# Patient Record
Sex: Female | Born: 1950 | Race: White | Hispanic: No | Marital: Married | Smoking: Never smoker
Health system: Southern US, Community
[De-identification: ages and names within clinical notes are randomized; demographics above are authoritative.]

## PROBLEM LIST (undated history)

## (undated) DIAGNOSIS — C50919 Malignant neoplasm of unspecified site of unspecified female breast: Secondary | ICD-10-CM

## (undated) DIAGNOSIS — J45909 Unspecified asthma, uncomplicated: Secondary | ICD-10-CM

## (undated) DIAGNOSIS — F32A Depression, unspecified: Secondary | ICD-10-CM

## (undated) DIAGNOSIS — E039 Hypothyroidism, unspecified: Secondary | ICD-10-CM

## (undated) DIAGNOSIS — E119 Type 2 diabetes mellitus without complications: Secondary | ICD-10-CM

## (undated) DIAGNOSIS — G4730 Sleep apnea, unspecified: Secondary | ICD-10-CM

## (undated) DIAGNOSIS — M1990 Unspecified osteoarthritis, unspecified site: Secondary | ICD-10-CM

## (undated) HISTORY — PX: GALLBLADDER SURGERY: SHX652

## (undated) HISTORY — PX: ABDOMINAL HYSTERECTOMY: SHX81

## (undated) HISTORY — PX: CATARACT EXTRACTION: SUR2

## (undated) HISTORY — DX: Malignant neoplasm of unspecified site of unspecified female breast: C50.919

---

## 1982-07-01 HISTORY — PX: VAGINAL HYSTERECTOMY: SUR661

## 2017-01-07 DIAGNOSIS — M9904 Segmental and somatic dysfunction of sacral region: Secondary | ICD-10-CM | POA: Diagnosis not present

## 2017-01-07 DIAGNOSIS — M9903 Segmental and somatic dysfunction of lumbar region: Secondary | ICD-10-CM | POA: Diagnosis not present

## 2017-01-07 DIAGNOSIS — M461 Sacroiliitis, not elsewhere classified: Secondary | ICD-10-CM | POA: Diagnosis not present

## 2017-01-07 DIAGNOSIS — M5136 Other intervertebral disc degeneration, lumbar region: Secondary | ICD-10-CM | POA: Diagnosis not present

## 2017-01-07 DIAGNOSIS — M9905 Segmental and somatic dysfunction of pelvic region: Secondary | ICD-10-CM | POA: Diagnosis not present

## 2017-01-07 DIAGNOSIS — M4316 Spondylolisthesis, lumbar region: Secondary | ICD-10-CM | POA: Diagnosis not present

## 2017-01-07 DIAGNOSIS — M5387 Other specified dorsopathies, lumbosacral region: Secondary | ICD-10-CM | POA: Diagnosis not present

## 2017-01-07 DIAGNOSIS — Q72812 Congenital shortening of left lower limb: Secondary | ICD-10-CM | POA: Diagnosis not present

## 2017-01-09 DIAGNOSIS — M5387 Other specified dorsopathies, lumbosacral region: Secondary | ICD-10-CM | POA: Diagnosis not present

## 2017-01-09 DIAGNOSIS — M9904 Segmental and somatic dysfunction of sacral region: Secondary | ICD-10-CM | POA: Diagnosis not present

## 2017-01-09 DIAGNOSIS — M9903 Segmental and somatic dysfunction of lumbar region: Secondary | ICD-10-CM | POA: Diagnosis not present

## 2017-01-09 DIAGNOSIS — M4316 Spondylolisthesis, lumbar region: Secondary | ICD-10-CM | POA: Diagnosis not present

## 2017-01-09 DIAGNOSIS — M5136 Other intervertebral disc degeneration, lumbar region: Secondary | ICD-10-CM | POA: Diagnosis not present

## 2017-01-09 DIAGNOSIS — M461 Sacroiliitis, not elsewhere classified: Secondary | ICD-10-CM | POA: Diagnosis not present

## 2017-01-09 DIAGNOSIS — M9905 Segmental and somatic dysfunction of pelvic region: Secondary | ICD-10-CM | POA: Diagnosis not present

## 2017-01-09 DIAGNOSIS — Q72812 Congenital shortening of left lower limb: Secondary | ICD-10-CM | POA: Diagnosis not present

## 2017-01-13 DIAGNOSIS — E119 Type 2 diabetes mellitus without complications: Secondary | ICD-10-CM | POA: Diagnosis not present

## 2017-01-13 DIAGNOSIS — H401131 Primary open-angle glaucoma, bilateral, mild stage: Secondary | ICD-10-CM | POA: Diagnosis not present

## 2017-01-13 DIAGNOSIS — H2513 Age-related nuclear cataract, bilateral: Secondary | ICD-10-CM | POA: Diagnosis not present

## 2017-01-13 DIAGNOSIS — H40033 Anatomical narrow angle, bilateral: Secondary | ICD-10-CM | POA: Diagnosis not present

## 2017-01-14 DIAGNOSIS — M461 Sacroiliitis, not elsewhere classified: Secondary | ICD-10-CM | POA: Diagnosis not present

## 2017-01-14 DIAGNOSIS — M5136 Other intervertebral disc degeneration, lumbar region: Secondary | ICD-10-CM | POA: Diagnosis not present

## 2017-01-14 DIAGNOSIS — M9905 Segmental and somatic dysfunction of pelvic region: Secondary | ICD-10-CM | POA: Diagnosis not present

## 2017-01-14 DIAGNOSIS — M9903 Segmental and somatic dysfunction of lumbar region: Secondary | ICD-10-CM | POA: Diagnosis not present

## 2017-01-14 DIAGNOSIS — M4316 Spondylolisthesis, lumbar region: Secondary | ICD-10-CM | POA: Diagnosis not present

## 2017-01-14 DIAGNOSIS — M9904 Segmental and somatic dysfunction of sacral region: Secondary | ICD-10-CM | POA: Diagnosis not present

## 2017-01-14 DIAGNOSIS — Q72812 Congenital shortening of left lower limb: Secondary | ICD-10-CM | POA: Diagnosis not present

## 2017-01-14 DIAGNOSIS — M5387 Other specified dorsopathies, lumbosacral region: Secondary | ICD-10-CM | POA: Diagnosis not present

## 2017-01-15 DIAGNOSIS — M5387 Other specified dorsopathies, lumbosacral region: Secondary | ICD-10-CM | POA: Diagnosis not present

## 2017-01-15 DIAGNOSIS — M9904 Segmental and somatic dysfunction of sacral region: Secondary | ICD-10-CM | POA: Diagnosis not present

## 2017-01-15 DIAGNOSIS — M9903 Segmental and somatic dysfunction of lumbar region: Secondary | ICD-10-CM | POA: Diagnosis not present

## 2017-01-15 DIAGNOSIS — M4316 Spondylolisthesis, lumbar region: Secondary | ICD-10-CM | POA: Diagnosis not present

## 2017-01-15 DIAGNOSIS — M9905 Segmental and somatic dysfunction of pelvic region: Secondary | ICD-10-CM | POA: Diagnosis not present

## 2017-01-15 DIAGNOSIS — Q72812 Congenital shortening of left lower limb: Secondary | ICD-10-CM | POA: Diagnosis not present

## 2017-01-15 DIAGNOSIS — M461 Sacroiliitis, not elsewhere classified: Secondary | ICD-10-CM | POA: Diagnosis not present

## 2017-01-15 DIAGNOSIS — M5136 Other intervertebral disc degeneration, lumbar region: Secondary | ICD-10-CM | POA: Diagnosis not present

## 2017-01-16 DIAGNOSIS — M9905 Segmental and somatic dysfunction of pelvic region: Secondary | ICD-10-CM | POA: Diagnosis not present

## 2017-01-16 DIAGNOSIS — M4316 Spondylolisthesis, lumbar region: Secondary | ICD-10-CM | POA: Diagnosis not present

## 2017-01-16 DIAGNOSIS — M5387 Other specified dorsopathies, lumbosacral region: Secondary | ICD-10-CM | POA: Diagnosis not present

## 2017-01-16 DIAGNOSIS — Q72812 Congenital shortening of left lower limb: Secondary | ICD-10-CM | POA: Diagnosis not present

## 2017-01-16 DIAGNOSIS — M9904 Segmental and somatic dysfunction of sacral region: Secondary | ICD-10-CM | POA: Diagnosis not present

## 2017-01-16 DIAGNOSIS — M9903 Segmental and somatic dysfunction of lumbar region: Secondary | ICD-10-CM | POA: Diagnosis not present

## 2017-01-16 DIAGNOSIS — M5136 Other intervertebral disc degeneration, lumbar region: Secondary | ICD-10-CM | POA: Diagnosis not present

## 2017-01-16 DIAGNOSIS — M461 Sacroiliitis, not elsewhere classified: Secondary | ICD-10-CM | POA: Diagnosis not present

## 2017-01-22 DIAGNOSIS — M9904 Segmental and somatic dysfunction of sacral region: Secondary | ICD-10-CM | POA: Diagnosis not present

## 2017-01-22 DIAGNOSIS — M5136 Other intervertebral disc degeneration, lumbar region: Secondary | ICD-10-CM | POA: Diagnosis not present

## 2017-01-22 DIAGNOSIS — M4316 Spondylolisthesis, lumbar region: Secondary | ICD-10-CM | POA: Diagnosis not present

## 2017-01-22 DIAGNOSIS — Q72812 Congenital shortening of left lower limb: Secondary | ICD-10-CM | POA: Diagnosis not present

## 2017-01-22 DIAGNOSIS — M9905 Segmental and somatic dysfunction of pelvic region: Secondary | ICD-10-CM | POA: Diagnosis not present

## 2017-01-22 DIAGNOSIS — M9903 Segmental and somatic dysfunction of lumbar region: Secondary | ICD-10-CM | POA: Diagnosis not present

## 2017-01-22 DIAGNOSIS — M461 Sacroiliitis, not elsewhere classified: Secondary | ICD-10-CM | POA: Diagnosis not present

## 2017-01-22 DIAGNOSIS — M5387 Other specified dorsopathies, lumbosacral region: Secondary | ICD-10-CM | POA: Diagnosis not present

## 2017-01-23 DIAGNOSIS — Q72812 Congenital shortening of left lower limb: Secondary | ICD-10-CM | POA: Diagnosis not present

## 2017-01-23 DIAGNOSIS — M4316 Spondylolisthesis, lumbar region: Secondary | ICD-10-CM | POA: Diagnosis not present

## 2017-01-23 DIAGNOSIS — M9904 Segmental and somatic dysfunction of sacral region: Secondary | ICD-10-CM | POA: Diagnosis not present

## 2017-01-23 DIAGNOSIS — M461 Sacroiliitis, not elsewhere classified: Secondary | ICD-10-CM | POA: Diagnosis not present

## 2017-01-23 DIAGNOSIS — M5136 Other intervertebral disc degeneration, lumbar region: Secondary | ICD-10-CM | POA: Diagnosis not present

## 2017-01-23 DIAGNOSIS — M9905 Segmental and somatic dysfunction of pelvic region: Secondary | ICD-10-CM | POA: Diagnosis not present

## 2017-01-23 DIAGNOSIS — M5387 Other specified dorsopathies, lumbosacral region: Secondary | ICD-10-CM | POA: Diagnosis not present

## 2017-01-23 DIAGNOSIS — M9903 Segmental and somatic dysfunction of lumbar region: Secondary | ICD-10-CM | POA: Diagnosis not present

## 2017-01-27 DIAGNOSIS — M461 Sacroiliitis, not elsewhere classified: Secondary | ICD-10-CM | POA: Diagnosis not present

## 2017-01-27 DIAGNOSIS — Q72812 Congenital shortening of left lower limb: Secondary | ICD-10-CM | POA: Diagnosis not present

## 2017-01-27 DIAGNOSIS — M9905 Segmental and somatic dysfunction of pelvic region: Secondary | ICD-10-CM | POA: Diagnosis not present

## 2017-01-27 DIAGNOSIS — M9904 Segmental and somatic dysfunction of sacral region: Secondary | ICD-10-CM | POA: Diagnosis not present

## 2017-01-27 DIAGNOSIS — M9903 Segmental and somatic dysfunction of lumbar region: Secondary | ICD-10-CM | POA: Diagnosis not present

## 2017-01-27 DIAGNOSIS — M4316 Spondylolisthesis, lumbar region: Secondary | ICD-10-CM | POA: Diagnosis not present

## 2017-01-27 DIAGNOSIS — M5136 Other intervertebral disc degeneration, lumbar region: Secondary | ICD-10-CM | POA: Diagnosis not present

## 2017-01-27 DIAGNOSIS — M5387 Other specified dorsopathies, lumbosacral region: Secondary | ICD-10-CM | POA: Diagnosis not present

## 2017-01-30 DIAGNOSIS — Q72812 Congenital shortening of left lower limb: Secondary | ICD-10-CM | POA: Diagnosis not present

## 2017-01-30 DIAGNOSIS — M9904 Segmental and somatic dysfunction of sacral region: Secondary | ICD-10-CM | POA: Diagnosis not present

## 2017-01-30 DIAGNOSIS — M5387 Other specified dorsopathies, lumbosacral region: Secondary | ICD-10-CM | POA: Diagnosis not present

## 2017-01-30 DIAGNOSIS — M461 Sacroiliitis, not elsewhere classified: Secondary | ICD-10-CM | POA: Diagnosis not present

## 2017-01-30 DIAGNOSIS — M9905 Segmental and somatic dysfunction of pelvic region: Secondary | ICD-10-CM | POA: Diagnosis not present

## 2017-01-30 DIAGNOSIS — M4316 Spondylolisthesis, lumbar region: Secondary | ICD-10-CM | POA: Diagnosis not present

## 2017-01-30 DIAGNOSIS — M9903 Segmental and somatic dysfunction of lumbar region: Secondary | ICD-10-CM | POA: Diagnosis not present

## 2017-01-30 DIAGNOSIS — M5136 Other intervertebral disc degeneration, lumbar region: Secondary | ICD-10-CM | POA: Diagnosis not present

## 2017-02-03 DIAGNOSIS — M9905 Segmental and somatic dysfunction of pelvic region: Secondary | ICD-10-CM | POA: Diagnosis not present

## 2017-02-03 DIAGNOSIS — M5136 Other intervertebral disc degeneration, lumbar region: Secondary | ICD-10-CM | POA: Diagnosis not present

## 2017-02-03 DIAGNOSIS — M5387 Other specified dorsopathies, lumbosacral region: Secondary | ICD-10-CM | POA: Diagnosis not present

## 2017-02-03 DIAGNOSIS — Q72812 Congenital shortening of left lower limb: Secondary | ICD-10-CM | POA: Diagnosis not present

## 2017-02-03 DIAGNOSIS — M9903 Segmental and somatic dysfunction of lumbar region: Secondary | ICD-10-CM | POA: Diagnosis not present

## 2017-02-03 DIAGNOSIS — M461 Sacroiliitis, not elsewhere classified: Secondary | ICD-10-CM | POA: Diagnosis not present

## 2017-02-03 DIAGNOSIS — M4316 Spondylolisthesis, lumbar region: Secondary | ICD-10-CM | POA: Diagnosis not present

## 2017-02-03 DIAGNOSIS — M9904 Segmental and somatic dysfunction of sacral region: Secondary | ICD-10-CM | POA: Diagnosis not present

## 2017-02-06 DIAGNOSIS — M461 Sacroiliitis, not elsewhere classified: Secondary | ICD-10-CM | POA: Diagnosis not present

## 2017-02-06 DIAGNOSIS — Q72812 Congenital shortening of left lower limb: Secondary | ICD-10-CM | POA: Diagnosis not present

## 2017-02-06 DIAGNOSIS — M9904 Segmental and somatic dysfunction of sacral region: Secondary | ICD-10-CM | POA: Diagnosis not present

## 2017-02-06 DIAGNOSIS — M9903 Segmental and somatic dysfunction of lumbar region: Secondary | ICD-10-CM | POA: Diagnosis not present

## 2017-02-06 DIAGNOSIS — M4316 Spondylolisthesis, lumbar region: Secondary | ICD-10-CM | POA: Diagnosis not present

## 2017-02-06 DIAGNOSIS — M9905 Segmental and somatic dysfunction of pelvic region: Secondary | ICD-10-CM | POA: Diagnosis not present

## 2017-02-06 DIAGNOSIS — M5136 Other intervertebral disc degeneration, lumbar region: Secondary | ICD-10-CM | POA: Diagnosis not present

## 2017-02-06 DIAGNOSIS — M5387 Other specified dorsopathies, lumbosacral region: Secondary | ICD-10-CM | POA: Diagnosis not present

## 2017-02-13 DIAGNOSIS — M9903 Segmental and somatic dysfunction of lumbar region: Secondary | ICD-10-CM | POA: Diagnosis not present

## 2017-02-13 DIAGNOSIS — Q72812 Congenital shortening of left lower limb: Secondary | ICD-10-CM | POA: Diagnosis not present

## 2017-02-13 DIAGNOSIS — M9904 Segmental and somatic dysfunction of sacral region: Secondary | ICD-10-CM | POA: Diagnosis not present

## 2017-02-13 DIAGNOSIS — M4316 Spondylolisthesis, lumbar region: Secondary | ICD-10-CM | POA: Diagnosis not present

## 2017-02-13 DIAGNOSIS — M461 Sacroiliitis, not elsewhere classified: Secondary | ICD-10-CM | POA: Diagnosis not present

## 2017-02-13 DIAGNOSIS — M9905 Segmental and somatic dysfunction of pelvic region: Secondary | ICD-10-CM | POA: Diagnosis not present

## 2017-02-13 DIAGNOSIS — M5136 Other intervertebral disc degeneration, lumbar region: Secondary | ICD-10-CM | POA: Diagnosis not present

## 2017-02-13 DIAGNOSIS — M5387 Other specified dorsopathies, lumbosacral region: Secondary | ICD-10-CM | POA: Diagnosis not present

## 2017-02-20 DIAGNOSIS — M9903 Segmental and somatic dysfunction of lumbar region: Secondary | ICD-10-CM | POA: Diagnosis not present

## 2017-02-20 DIAGNOSIS — M5136 Other intervertebral disc degeneration, lumbar region: Secondary | ICD-10-CM | POA: Diagnosis not present

## 2017-02-20 DIAGNOSIS — M5387 Other specified dorsopathies, lumbosacral region: Secondary | ICD-10-CM | POA: Diagnosis not present

## 2017-02-20 DIAGNOSIS — M461 Sacroiliitis, not elsewhere classified: Secondary | ICD-10-CM | POA: Diagnosis not present

## 2017-02-20 DIAGNOSIS — Q72812 Congenital shortening of left lower limb: Secondary | ICD-10-CM | POA: Diagnosis not present

## 2017-02-20 DIAGNOSIS — M9904 Segmental and somatic dysfunction of sacral region: Secondary | ICD-10-CM | POA: Diagnosis not present

## 2017-02-20 DIAGNOSIS — M9905 Segmental and somatic dysfunction of pelvic region: Secondary | ICD-10-CM | POA: Diagnosis not present

## 2017-02-20 DIAGNOSIS — M4316 Spondylolisthesis, lumbar region: Secondary | ICD-10-CM | POA: Diagnosis not present

## 2017-03-06 DIAGNOSIS — M5136 Other intervertebral disc degeneration, lumbar region: Secondary | ICD-10-CM | POA: Diagnosis not present

## 2017-03-06 DIAGNOSIS — M9904 Segmental and somatic dysfunction of sacral region: Secondary | ICD-10-CM | POA: Diagnosis not present

## 2017-03-06 DIAGNOSIS — M461 Sacroiliitis, not elsewhere classified: Secondary | ICD-10-CM | POA: Diagnosis not present

## 2017-03-06 DIAGNOSIS — M5387 Other specified dorsopathies, lumbosacral region: Secondary | ICD-10-CM | POA: Diagnosis not present

## 2017-03-06 DIAGNOSIS — M4316 Spondylolisthesis, lumbar region: Secondary | ICD-10-CM | POA: Diagnosis not present

## 2017-03-06 DIAGNOSIS — G4733 Obstructive sleep apnea (adult) (pediatric): Secondary | ICD-10-CM | POA: Diagnosis not present

## 2017-03-06 DIAGNOSIS — H409 Unspecified glaucoma: Secondary | ICD-10-CM | POA: Diagnosis not present

## 2017-03-06 DIAGNOSIS — G4700 Insomnia, unspecified: Secondary | ICD-10-CM | POA: Diagnosis not present

## 2017-03-06 DIAGNOSIS — K635 Polyp of colon: Secondary | ICD-10-CM | POA: Diagnosis not present

## 2017-03-06 DIAGNOSIS — M9903 Segmental and somatic dysfunction of lumbar region: Secondary | ICD-10-CM | POA: Diagnosis not present

## 2017-03-06 DIAGNOSIS — F324 Major depressive disorder, single episode, in partial remission: Secondary | ICD-10-CM | POA: Diagnosis not present

## 2017-03-06 DIAGNOSIS — Z1389 Encounter for screening for other disorder: Secondary | ICD-10-CM | POA: Diagnosis not present

## 2017-03-06 DIAGNOSIS — Q72812 Congenital shortening of left lower limb: Secondary | ICD-10-CM | POA: Diagnosis not present

## 2017-03-06 DIAGNOSIS — E7800 Pure hypercholesterolemia, unspecified: Secondary | ICD-10-CM | POA: Diagnosis not present

## 2017-03-06 DIAGNOSIS — M9905 Segmental and somatic dysfunction of pelvic region: Secondary | ICD-10-CM | POA: Diagnosis not present

## 2017-03-06 DIAGNOSIS — J45909 Unspecified asthma, uncomplicated: Secondary | ICD-10-CM | POA: Diagnosis not present

## 2017-03-06 DIAGNOSIS — Z6841 Body Mass Index (BMI) 40.0 and over, adult: Secondary | ICD-10-CM | POA: Diagnosis not present

## 2017-03-20 DIAGNOSIS — M9905 Segmental and somatic dysfunction of pelvic region: Secondary | ICD-10-CM | POA: Diagnosis not present

## 2017-03-20 DIAGNOSIS — M9903 Segmental and somatic dysfunction of lumbar region: Secondary | ICD-10-CM | POA: Diagnosis not present

## 2017-03-20 DIAGNOSIS — M9904 Segmental and somatic dysfunction of sacral region: Secondary | ICD-10-CM | POA: Diagnosis not present

## 2017-03-20 DIAGNOSIS — M4316 Spondylolisthesis, lumbar region: Secondary | ICD-10-CM | POA: Diagnosis not present

## 2017-03-20 DIAGNOSIS — Q72812 Congenital shortening of left lower limb: Secondary | ICD-10-CM | POA: Diagnosis not present

## 2017-03-20 DIAGNOSIS — M5387 Other specified dorsopathies, lumbosacral region: Secondary | ICD-10-CM | POA: Diagnosis not present

## 2017-03-20 DIAGNOSIS — M461 Sacroiliitis, not elsewhere classified: Secondary | ICD-10-CM | POA: Diagnosis not present

## 2017-03-20 DIAGNOSIS — M5136 Other intervertebral disc degeneration, lumbar region: Secondary | ICD-10-CM | POA: Diagnosis not present

## 2017-05-08 ENCOUNTER — Other Ambulatory Visit: Payer: Self-pay | Admitting: Internal Medicine

## 2017-05-08 DIAGNOSIS — Z1159 Encounter for screening for other viral diseases: Secondary | ICD-10-CM | POA: Diagnosis not present

## 2017-05-08 DIAGNOSIS — E7800 Pure hypercholesterolemia, unspecified: Secondary | ICD-10-CM | POA: Diagnosis not present

## 2017-05-08 DIAGNOSIS — H409 Unspecified glaucoma: Secondary | ICD-10-CM | POA: Diagnosis not present

## 2017-05-08 DIAGNOSIS — Z780 Asymptomatic menopausal state: Secondary | ICD-10-CM | POA: Diagnosis not present

## 2017-05-08 DIAGNOSIS — Z0000 Encounter for general adult medical examination without abnormal findings: Secondary | ICD-10-CM | POA: Diagnosis not present

## 2017-05-08 DIAGNOSIS — G4700 Insomnia, unspecified: Secondary | ICD-10-CM | POA: Diagnosis not present

## 2017-05-08 DIAGNOSIS — F325 Major depressive disorder, single episode, in full remission: Secondary | ICD-10-CM | POA: Diagnosis not present

## 2017-05-08 DIAGNOSIS — E039 Hypothyroidism, unspecified: Secondary | ICD-10-CM | POA: Diagnosis not present

## 2017-05-08 DIAGNOSIS — Z1389 Encounter for screening for other disorder: Secondary | ICD-10-CM | POA: Diagnosis not present

## 2017-05-08 DIAGNOSIS — Z23 Encounter for immunization: Secondary | ICD-10-CM | POA: Diagnosis not present

## 2017-05-08 DIAGNOSIS — Z1231 Encounter for screening mammogram for malignant neoplasm of breast: Secondary | ICD-10-CM

## 2017-05-08 DIAGNOSIS — J45909 Unspecified asthma, uncomplicated: Secondary | ICD-10-CM | POA: Diagnosis not present

## 2017-05-08 DIAGNOSIS — G4733 Obstructive sleep apnea (adult) (pediatric): Secondary | ICD-10-CM | POA: Diagnosis not present

## 2017-05-08 DIAGNOSIS — R7303 Prediabetes: Secondary | ICD-10-CM | POA: Diagnosis not present

## 2017-05-16 DIAGNOSIS — H2513 Age-related nuclear cataract, bilateral: Secondary | ICD-10-CM | POA: Diagnosis not present

## 2017-05-16 DIAGNOSIS — E119 Type 2 diabetes mellitus without complications: Secondary | ICD-10-CM | POA: Diagnosis not present

## 2017-05-16 DIAGNOSIS — H401131 Primary open-angle glaucoma, bilateral, mild stage: Secondary | ICD-10-CM | POA: Diagnosis not present

## 2017-06-05 ENCOUNTER — Ambulatory Visit
Admission: RE | Admit: 2017-06-05 | Discharge: 2017-06-05 | Disposition: A | Discharge status: Home / Self Care | Payer: Medicare Other | Source: Ambulatory Visit | Attending: Internal Medicine | Admitting: Internal Medicine

## 2017-06-05 DIAGNOSIS — Z1231 Encounter for screening mammogram for malignant neoplasm of breast: Secondary | ICD-10-CM | POA: Diagnosis not present

## 2017-07-01 HISTORY — PX: BREAST BIOPSY: SHX20

## 2017-07-17 DIAGNOSIS — Z780 Asymptomatic menopausal state: Secondary | ICD-10-CM | POA: Diagnosis not present

## 2017-07-17 DIAGNOSIS — H01021 Squamous blepharitis right upper eyelid: Secondary | ICD-10-CM | POA: Diagnosis not present

## 2017-07-17 DIAGNOSIS — H401131 Primary open-angle glaucoma, bilateral, mild stage: Secondary | ICD-10-CM | POA: Diagnosis not present

## 2017-07-17 DIAGNOSIS — E119 Type 2 diabetes mellitus without complications: Secondary | ICD-10-CM | POA: Diagnosis not present

## 2017-07-17 DIAGNOSIS — H01025 Squamous blepharitis left lower eyelid: Secondary | ICD-10-CM | POA: Diagnosis not present

## 2017-07-17 DIAGNOSIS — H10413 Chronic giant papillary conjunctivitis, bilateral: Secondary | ICD-10-CM | POA: Diagnosis not present

## 2017-07-17 DIAGNOSIS — H01024 Squamous blepharitis left upper eyelid: Secondary | ICD-10-CM | POA: Diagnosis not present

## 2017-07-17 DIAGNOSIS — H2513 Age-related nuclear cataract, bilateral: Secondary | ICD-10-CM | POA: Diagnosis not present

## 2017-07-17 DIAGNOSIS — H01022 Squamous blepharitis right lower eyelid: Secondary | ICD-10-CM | POA: Diagnosis not present

## 2017-07-22 DIAGNOSIS — G4733 Obstructive sleep apnea (adult) (pediatric): Secondary | ICD-10-CM | POA: Diagnosis not present

## 2017-08-05 ENCOUNTER — Ambulatory Visit (HOSPITAL_COMMUNITY): Payer: Medicare Other | Admitting: Psychiatry

## 2017-09-09 DIAGNOSIS — Z1389 Encounter for screening for other disorder: Secondary | ICD-10-CM | POA: Diagnosis not present

## 2017-09-09 DIAGNOSIS — Z7984 Long term (current) use of oral hypoglycemic drugs: Secondary | ICD-10-CM | POA: Diagnosis not present

## 2017-09-09 DIAGNOSIS — E1165 Type 2 diabetes mellitus with hyperglycemia: Secondary | ICD-10-CM | POA: Diagnosis not present

## 2017-09-15 DIAGNOSIS — E119 Type 2 diabetes mellitus without complications: Secondary | ICD-10-CM | POA: Diagnosis not present

## 2017-09-15 DIAGNOSIS — H10413 Chronic giant papillary conjunctivitis, bilateral: Secondary | ICD-10-CM | POA: Diagnosis not present

## 2017-09-15 DIAGNOSIS — H01021 Squamous blepharitis right upper eyelid: Secondary | ICD-10-CM | POA: Diagnosis not present

## 2017-09-15 DIAGNOSIS — H2513 Age-related nuclear cataract, bilateral: Secondary | ICD-10-CM | POA: Diagnosis not present

## 2017-09-15 DIAGNOSIS — H01025 Squamous blepharitis left lower eyelid: Secondary | ICD-10-CM | POA: Diagnosis not present

## 2017-09-15 DIAGNOSIS — H01022 Squamous blepharitis right lower eyelid: Secondary | ICD-10-CM | POA: Diagnosis not present

## 2017-09-15 DIAGNOSIS — H401131 Primary open-angle glaucoma, bilateral, mild stage: Secondary | ICD-10-CM | POA: Diagnosis not present

## 2017-09-15 DIAGNOSIS — H01024 Squamous blepharitis left upper eyelid: Secondary | ICD-10-CM | POA: Diagnosis not present

## 2017-10-07 ENCOUNTER — Ambulatory Visit: Payer: Medicare Other

## 2017-10-13 DIAGNOSIS — H01021 Squamous blepharitis right upper eyelid: Secondary | ICD-10-CM | POA: Diagnosis not present

## 2017-10-13 DIAGNOSIS — H01024 Squamous blepharitis left upper eyelid: Secondary | ICD-10-CM | POA: Diagnosis not present

## 2017-10-13 DIAGNOSIS — H01022 Squamous blepharitis right lower eyelid: Secondary | ICD-10-CM | POA: Diagnosis not present

## 2017-10-13 DIAGNOSIS — H01025 Squamous blepharitis left lower eyelid: Secondary | ICD-10-CM | POA: Diagnosis not present

## 2017-10-13 DIAGNOSIS — E119 Type 2 diabetes mellitus without complications: Secondary | ICD-10-CM | POA: Diagnosis not present

## 2017-10-13 DIAGNOSIS — H401131 Primary open-angle glaucoma, bilateral, mild stage: Secondary | ICD-10-CM | POA: Diagnosis not present

## 2017-10-13 DIAGNOSIS — H10413 Chronic giant papillary conjunctivitis, bilateral: Secondary | ICD-10-CM | POA: Diagnosis not present

## 2017-10-13 DIAGNOSIS — H2513 Age-related nuclear cataract, bilateral: Secondary | ICD-10-CM | POA: Diagnosis not present

## 2017-10-14 ENCOUNTER — Ambulatory Visit: Payer: Medicare Other

## 2017-10-17 ENCOUNTER — Ambulatory Visit: Payer: Medicare Other | Admitting: *Deleted

## 2017-10-21 ENCOUNTER — Ambulatory Visit: Payer: Medicare Other

## 2017-10-22 ENCOUNTER — Encounter: Payer: Medicare Other | Attending: Internal Medicine | Admitting: *Deleted

## 2017-10-22 DIAGNOSIS — E119 Type 2 diabetes mellitus without complications: Secondary | ICD-10-CM

## 2017-10-22 DIAGNOSIS — E1165 Type 2 diabetes mellitus with hyperglycemia: Secondary | ICD-10-CM | POA: Insufficient documentation

## 2017-10-22 DIAGNOSIS — Z713 Dietary counseling and surveillance: Secondary | ICD-10-CM | POA: Insufficient documentation

## 2017-10-22 NOTE — Patient Instructions (Signed)
Plan:  Aim for 2 Carb Choices per meal (30 grams) +/- 1 either way  Aim for 0-1 Carbs per snack if hungry  Include protein in moderation with your meals and snacks Consider reading food labels for Total Carbohydrate of foods Try the [employer] Essentials for your breakfast meal Consider  increasing your activity level daily as tolerated Consider obtaining a meter or supplies for the meter you have and also checking BG at alternate times per day  Continue taking medication as directed by MD

## 2017-10-30 NOTE — Progress Notes (Signed)
Diabetes Self-Management Education  Visit Type: First/Initial  Appt. Start Time: 1530 Appt. End Time: 1700  10/30/2017  Kayla Ray, identified by name and date of birth, is a 67 y.o. female with a diagnosis of Diabetes: Type 2. She retired in [place] and moved here last June. Since then she has gained weight due to being in new area and not getting regular exercise. She and her husband are volunteering a few days a week which is helpful. They have family here so can now be near grandchildren too. She does not have a meter yet, diet history obtained.   ASSESSMENT  Height  (1.473 m), weight 245 lb 9.6 oz (111.4 kg). Body mass index is 51.33 kg/m.  Diabetes Self-Management Education - 10/30/17 1249      Visit Information   Visit Type  First/Initial      Initial Visit   Diabetes Type  Type 2    Are you currently following a meal plan?  No    Are you taking your medications as prescribed?  Yes    Date Diagnosed  6 months ago      Health Coping   How would you rate your overall health?  Fair      Psychosocial Assessment   Patient Belief/Attitude about Diabetes  Motivated to manage diabetes    Other persons present  Patient    Patient Concerns  Nutrition/Meal planning;Glycemic Control    Learning Readiness  Change in progress    What is the last grade level you completed in school?  1 year post high school      Pre-Education Assessment   Patient understands the diabetes disease and treatment process.  Needs Instruction    Patient understands incorporating nutritional management into lifestyle.  Needs Instruction    Patient undertands incorporating physical activity into lifestyle.  Needs Instruction    Patient understands using medications safely.  Needs Instruction    Patient understands monitoring blood glucose, interpreting and using results  Needs Instruction    Patient understands prevention, detection, and treatment of acute complications.  Needs Instruction    Patient understands prevention, detection, and treatment of chronic complications.  Needs Instruction    Patient understands how to develop strategies to address psychosocial issues.  Needs Instruction    Patient understands how to develop strategies to promote health/change behavior.  Needs Instruction      Complications   Last HgB A1C per patient/outside source  7.6 %    How often do you check your blood sugar?  0 times/day (not testing)    Have you had a dilated eye exam in the past 12 months?  Yes    Have you had a dental exam in the past 12 months?  Yes    Are you checking your feet?  No      Dietary Intake   Breakfast  protein shake with 30 g protein    Snack (morning)  no    Lunch  sandwich OR grilled chicken salad OR cereal with raisins and nuts    Dinner  cooks about half the time; salad with grilled chicken OR meat, starch and vegetable meal    Snack (evening)  yogurt covered raisins, balance smart cup with cheese and nuts, popcorn, or cheese and crackers    Beverage(s)  coffee with vanilla creamer, half and half tea, water      Exercise   Exercise Type  ADL's    How many days per week to you exercise?  0    How many minutes per day do you exercise?  0    Total minutes per week of exercise  0      Patient Education   Previous Diabetes Education  No    Disease state   Definition of diabetes, type 1 and 2, and the diagnosis of diabetes    Nutrition management   Role of diet in the treatment of diabetes and the relationship between the three main macronutrients and blood glucose level;Carbohydrate counting;Food label reading, portion sizes and measuring food.    Physical activity and exercise   Role of exercise on diabetes management, blood pressure control and cardiac health.    Medications  Reviewed patients medication for diabetes, action, purpose, timing of dose and side effects.    Monitoring  Purpose and frequency of SMBG.;Identified appropriate SMBG and/or A1C goals.     Acute complications  Taught treatment of hypoglycemia - the 15 rule.    Chronic complications  Relationship between chronic complications and blood glucose control    Psychosocial adjustment  Role of stress on diabetes      Individualized Goals (developed by patient)   Nutrition  General guidelines for healthy choices and portions discussed    Physical Activity  Exercise 3-5 times per week    Medications  take my medication as prescribed      Post-Education Assessment   Patient understands the diabetes disease and treatment process.  Demonstrates understanding / competency    Patient understands incorporating nutritional management into lifestyle.  Demonstrates understanding / competency    Patient undertands incorporating physical activity into lifestyle.  Demonstrates understanding / competency    Patient understands using medications safely.  Demonstrates understanding / competency    Patient understands monitoring blood glucose, interpreting and using results  Demonstrates understanding / competency    Patient understands prevention, detection, and treatment of acute complications.  Demonstrates understanding / competency    Patient understands prevention, detection, and treatment of chronic complications.  Demonstrates understanding / competency    Patient understands how to develop strategies to address psychosocial issues.  Demonstrates understanding / competency    Patient understands how to develop strategies to promote health/change behavior.  Demonstrates understanding / competency      Outcomes   Expected Outcomes  Demonstrated interest in learning. Expect positive outcomes    Future DMSE  PRN    Program Status  Completed       Individualized Plan for Diabetes Self-Management Training:   Learning Objective:  Patient will have a greater understanding of diabetes self-management. Patient education plan is to attend individual and/or group sessions per assessed needs and  concerns.   Plan:   Patient Instructions  Plan:  Aim for 2 Carb Choices per meal (30 grams) +/- 1 either way  Aim for 0-1 Carbs per snack if hungry  Include protein in moderation with your meals and snacks Consider reading food labels for Total Carbohydrate of foods Try the [employer] Essentials for your breakfast meal in place of high protein shake Consider  increasing your activity level daily as tolerated Consider obtaining a meter or supplies for the meter you have and also checking BG at alternate times per day  Continue taking medication as directed by MD  Expected Outcomes:  Demonstrated interest in learning. Expect positive outcomes  Education material provided: Living Well with Diabetes, Food label handouts, A1C conversion sheet, Meal plan card and Carbohydrate counting sheet  If problems or questions, patient to contact team via:  Phone  Future DSME appointment: PRN

## 2017-11-03 ENCOUNTER — Ambulatory Visit
Admission: RE | Admit: 2017-11-03 | Discharge: 2017-11-03 | Disposition: A | Discharge status: Home / Self Care | Payer: Medicare Other | Source: Ambulatory Visit | Attending: Internal Medicine | Admitting: Internal Medicine

## 2017-11-03 ENCOUNTER — Other Ambulatory Visit: Payer: Self-pay | Admitting: Internal Medicine

## 2017-11-03 DIAGNOSIS — M545 Low back pain: Secondary | ICD-10-CM | POA: Diagnosis not present

## 2017-11-03 DIAGNOSIS — M47816 Spondylosis without myelopathy or radiculopathy, lumbar region: Secondary | ICD-10-CM | POA: Diagnosis not present

## 2017-11-03 DIAGNOSIS — M7918 Myalgia, other site: Secondary | ICD-10-CM | POA: Diagnosis not present

## 2017-11-03 DIAGNOSIS — E1165 Type 2 diabetes mellitus with hyperglycemia: Secondary | ICD-10-CM | POA: Diagnosis not present

## 2017-11-03 DIAGNOSIS — M5489 Other dorsalgia: Secondary | ICD-10-CM

## 2017-11-03 DIAGNOSIS — M48061 Spinal stenosis, lumbar region without neurogenic claudication: Secondary | ICD-10-CM | POA: Diagnosis not present

## 2017-11-03 DIAGNOSIS — E782 Mixed hyperlipidemia: Secondary | ICD-10-CM | POA: Diagnosis not present

## 2017-11-03 DIAGNOSIS — Z6841 Body Mass Index (BMI) 40.0 and over, adult: Secondary | ICD-10-CM | POA: Diagnosis not present

## 2017-11-03 DIAGNOSIS — Z7984 Long term (current) use of oral hypoglycemic drugs: Secondary | ICD-10-CM | POA: Diagnosis not present

## 2017-11-07 DIAGNOSIS — M545 Low back pain: Secondary | ICD-10-CM | POA: Diagnosis not present

## 2017-11-11 DIAGNOSIS — M545 Low back pain: Secondary | ICD-10-CM | POA: Diagnosis not present

## 2017-11-13 DIAGNOSIS — M545 Low back pain: Secondary | ICD-10-CM | POA: Diagnosis not present

## 2017-11-18 DIAGNOSIS — M545 Low back pain: Secondary | ICD-10-CM | POA: Diagnosis not present

## 2017-11-20 DIAGNOSIS — M545 Low back pain: Secondary | ICD-10-CM | POA: Diagnosis not present

## 2017-11-21 ENCOUNTER — Encounter: Payer: Medicare Other | Attending: Internal Medicine | Admitting: *Deleted

## 2017-11-21 DIAGNOSIS — E1165 Type 2 diabetes mellitus with hyperglycemia: Secondary | ICD-10-CM | POA: Diagnosis not present

## 2017-11-21 DIAGNOSIS — E119 Type 2 diabetes mellitus without complications: Secondary | ICD-10-CM

## 2017-11-21 DIAGNOSIS — Z713 Dietary counseling and surveillance: Secondary | ICD-10-CM | POA: Insufficient documentation

## 2017-11-21 NOTE — Patient Instructions (Signed)
Plan:   Aim for 2 Carb Choices per meal (30 grams) +/- 1 either way   Aim for 0-1 Carbs per snack if hungry   Include protein in moderation with your meals and snacks  Consider reading food labels for Total Carbohydrate of foods  Try the [employer] Essentials for your breakfast meal  Consider  increasing your activity level daily as tolerated  Congratulations on getting a meter! I taught you how to use it today and we discussed testing twice a day for now and if all BG are within the target ranges, then less often is fine if OK with your MD.    Continue taking medication as directed by MD

## 2017-11-21 NOTE — Progress Notes (Signed)
Diabetes Self-Management Education  Visit Type:  Follow-up  Appt. Start Time: 1130 Appt. End Time: 1200  11/21/2017  Kayla Ray, identified by name and date of birth, is a 67 y.o. female with a diagnosis of Diabetes: Type 2.  She brought her [employer] meter to day and would like to learn how to use it. She states she has been getting physical therapy for her back pain and it is helping a lot so she is able to walk more than she could before. She does not feel she has made any food choice changes at the beginning of the visit but by the end of the visit she realized she is limiting her carb choices to 2 per meal after all.  ASSESSMENT  Height  (1.473 m), weight 247 lb 8 oz (112.3 kg). Body mass index is 51.73 kg/m.   Diabetes Self-Management Education - 11/21/17 1219      Health Coping   How would you rate your overall health?  Fair      Psychosocial Assessment   Patient Belief/Attitude about Diabetes  Motivated to manage diabetes    Self-care barriers  None    Self-management support  Family    Patient Concerns  Nutrition/Meal planning;Weight Control;Glycemic Control    Special Needs  None    Learning Readiness  Change in progress      Complications   How often do you check your blood sugar?  0 times/day (not testing) brought new meter today for instruction      Exercise   Exercise Type  ADL's is getting physical therapy for back pain and able to walk more now      Patient Education   Physical activity and exercise   Helped patient identify appropriate exercises in relation to his/her diabetes, diabetes complications and other health issue.    Monitoring  Taught/evaluated SMBG meter.;Identified appropriate SMBG and/or A1C goals.      Individualized Goals (developed by patient)   Nutrition  General guidelines for healthy choices and portions discussed    Physical Activity  Exercise 1-2 times per week    Medications  take my medication as prescribed    Monitoring   test blood glucose pre and post meals as discussed      Patient Self-Evaluation of Goals - Patient rates self as meeting previously set goals (% of time)   Nutrition  >75%    Physical Activity  25 - 50%    Medications  >75%    Reducing Risk  >75%    Health Coping  >75%      Outcomes   Program Status  Completed      Subsequent Visit   Since your last visit have you continued or begun to take your medications as prescribed?  Yes    Since your last visit have you experienced any weight changes?  No change      Learning Objective:  Patient will have a greater understanding of diabetes self-management. Patient education plan is to attend individual and/or group sessions per assessed needs and concerns.  BG today when learning to use the new meter was 138 mg/dl after eating, so that is below the 180 mg/dl target range!  Plan:   Patient Instructions  Plan:   Aim for 2 Carb Choices per meal (30 grams) +/- 1 either way   Aim for 0-1 Carbs per snack if hungry   Include protein in moderation with your meals and snacks  Consider reading food  labels for Total Carbohydrate of foods  Try the [employer] for your breakfast meal  Consider  increasing your activity level daily as tolerated  Congratulations on getting a meter! I taught you how to use it today and we discussed testing twice a day for now and if all BG are within the target ranges, then less often is fine if OK with your MD.    Continue taking medication as directed by MD  Expected Outcomes:  Demonstrated interest in learning. Expect positive outcomes  Education material provided: A1C conversion sheet with BG Target Ranges stated  If problems or questions, patient to contact team via:  Phone  Future DSME appointment: - PRN

## 2017-12-03 ENCOUNTER — Encounter (HOSPITAL_COMMUNITY): Payer: Self-pay

## 2017-12-03 ENCOUNTER — Emergency Department (HOSPITAL_COMMUNITY): Payer: Medicare Other

## 2017-12-03 ENCOUNTER — Emergency Department (HOSPITAL_COMMUNITY)
Admission: EM | Admit: 2017-12-03 | Discharge: 2017-12-04 | Disposition: A | Discharge status: Home / Self Care | Payer: Medicare Other | Attending: Emergency Medicine | Admitting: Emergency Medicine

## 2017-12-03 DIAGNOSIS — J4531 Mild persistent asthma with (acute) exacerbation: Secondary | ICD-10-CM | POA: Diagnosis not present

## 2017-12-03 DIAGNOSIS — R0602 Shortness of breath: Secondary | ICD-10-CM | POA: Diagnosis not present

## 2017-12-03 DIAGNOSIS — J069 Acute upper respiratory infection, unspecified: Secondary | ICD-10-CM | POA: Diagnosis not present

## 2017-12-03 DIAGNOSIS — B349 Viral infection, unspecified: Secondary | ICD-10-CM | POA: Insufficient documentation

## 2017-12-03 DIAGNOSIS — J4521 Mild intermittent asthma with (acute) exacerbation: Secondary | ICD-10-CM

## 2017-12-03 DIAGNOSIS — R05 Cough: Secondary | ICD-10-CM | POA: Diagnosis not present

## 2017-12-03 HISTORY — DX: Unspecified asthma, uncomplicated: J45.909

## 2017-12-03 HISTORY — DX: Type 2 diabetes mellitus without complications: E11.9

## 2017-12-03 LAB — CBC
HCT: 38.2 % (ref 36.0–46.0)
Hemoglobin: 12.5 g/dL (ref 12.0–15.0)
MCH: 28 pg (ref 26.0–34.0)
MCHC: 32.7 g/dL (ref 30.0–36.0)
MCV: 85.5 fL (ref 78.0–100.0)
PLATELETS: 264 10*3/uL (ref 150–400)
RBC: 4.47 MIL/uL (ref 3.87–5.11)
RDW: 15.5 % (ref 11.5–15.5)
WBC: 11.6 10*3/uL — AB (ref 4.0–10.5)

## 2017-12-03 LAB — BASIC METABOLIC PANEL
Anion gap: 11 (ref 5–15)
BUN: 9 mg/dL (ref 6–20)
CHLORIDE: 99 mmol/L — AB (ref 101–111)
CO2: 24 mmol/L (ref 22–32)
Calcium: 9.1 mg/dL (ref 8.9–10.3)
Creatinine, Ser: 0.77 mg/dL (ref 0.44–1.00)
Glucose, Bld: 128 mg/dL — ABNORMAL HIGH (ref 65–99)
POTASSIUM: 4.1 mmol/L (ref 3.5–5.1)
SODIUM: 134 mmol/L — AB (ref 135–145)

## 2017-12-03 MED ORDER — IPRATROPIUM-ALBUTEROL 0.5-2.5 (3) MG/3ML IN SOLN
3.0000 mL | Freq: Once | RESPIRATORY_TRACT | Status: AC
  Administered 2017-12-03: 3 mL via RESPIRATORY_TRACT
  Filled 2017-12-03: qty 3

## 2017-12-03 MED ORDER — METHYLPREDNISOLONE SODIUM SUCC 125 MG IJ SOLR
125.0000 mg | Freq: Once | INTRAMUSCULAR | Status: AC
  Administered 2017-12-03: 125 mg via INTRAVENOUS
  Filled 2017-12-03: qty 2

## 2017-12-03 MED ORDER — MAGNESIUM SULFATE 2 GM/50ML IV SOLN
2.0000 g | Freq: Once | INTRAVENOUS | Status: AC
  Administered 2017-12-03: 2 g via INTRAVENOUS
  Filled 2017-12-03: qty 50

## 2017-12-03 NOTE — ED Notes (Signed)
EKG given to EDP,Little,MD., for review. 

## 2017-12-03 NOTE — ED Provider Notes (Signed)
[facility]-EMERGENCY DEPT Provider Note   CSN: [id number] Arrival date & time: 12/03/17  2159     History   Chief Complaint Chief Complaint  Patient presents with  . Shortness of Breath    HPI Kayla Ray is a 67 y.o. female.  67 year old female with past medical history including asthma, diabetes who presents with shortness of breath.  A few days ago while they were on vacation, the patient began having cough associated with nasal congestion and some wheezing.  Her wheezing has progressively worsened and when they got home she began using her albuterol nebulizer.  Her shortness of breath is worsened despite the albuterol and she states that she last had a breathing treatment at 930 with no relief.  She denies any associated chest pain, fevers, nausea, vomiting, diarrhea.  Her husband was recently ill with URI symptoms.  This feels like previous asthma exacerbations.  Last exacerbation was approximately a year ago.  She has been hospitalized once but no history of intubation.   Shortness of Breath  Associated symptoms include cough. Pertinent negatives include no fever and no chest pain.    Past Medical History:  Diagnosis Date  . Asthma   . Diabetes mellitus without complication (HCC)     There are no active problems to display for this patient.   History reviewed. No pertinent surgical history.   OB History   None      Home Medications    Prior to Admission medications   Medication Sig Start Date End Date Taking? Authorizing Provider  albuterol (PROVENTIL HFA;VENTOLIN HFA) 108 (90 Base) MCG/ACT inhaler Inhale 1-2 puffs into the lungs every 6 (six) hours as needed for wheezing or shortness of breath.   Yes [provider]  albuterol (PROVENTIL) (2.5 MG/3ML) 0.083% nebulizer solution Take 2.5 mg by nebulization every 6 (six) hours as needed for wheezing or shortness of breath.   Yes [provider]  atorvastatin (LIPITOR) 10 MG  tablet Take 10 mg by mouth daily. 11/03/17  Yes [provider]  glimepiride (AMARYL) 2 MG tablet Take 2 mg by mouth daily with breakfast.   Yes [provider]  levothyroxine (SYNTHROID, LEVOTHROID) 50 MCG tablet Take 50 mcg by mouth daily before breakfast.   Yes [provider]  MELATONIN PO Take 1 tablet by mouth at bedtime as needed (sleep).   Yes [provider]  montelukast (SINGULAIR) 10 MG tablet Take 10 mg by mouth at bedtime.   Yes [provider]  PRESCRIPTION MEDICATION 1 each daily.   Yes [provider]  traZODone (DESYREL) 150 MG tablet Take 150 mg by mouth at bedtime.   Yes [provider]  Vilazodone HCl (VIIBRYD) 20 MG TABS Take 60 mg by mouth daily.   Yes [provider]  albuterol (PROVENTIL) (2.5 MG/3ML) 0.083% nebulizer solution Take 3 mLs (2.5 mg total) by nebulization every 6 (six) hours as needed for wheezing or shortness of breath. 12/04/17   Little, Ambrose Finland, MD  predniSONE (DELTASONE) 20 MG tablet Take 2 tablets (40 mg total) by mouth daily. 12/04/17   Little, Ambrose Finland, MD  promethazine-dextromethorphan (PROMETHAZINE-DM) 6.25-15 MG/5ML syrup Take 5 mLs by mouth every 8 (eight) hours as needed for up to 4 days for cough. 12/04/17 12/08/17  Little, Ambrose Finland, MD    Family History History reviewed. No pertinent family history.  Social History Social History   Tobacco Use  . Smoking status: Never Smoker  . Smokeless tobacco: Never  Used  Substance Use Topics  . Alcohol use: Never    Frequency: Never  . Drug use: Not on file     Allergies   Bactrim [sulfamethoxazole-trimethoprim]   Review of Systems Review of Systems  Constitutional: Negative for fever.  Respiratory: Positive for cough and shortness of breath.   Cardiovascular: Negative for chest pain.   All other systems reviewed and are negative except that which was mentioned in HPI   Physical Exam Updated Vital Signs BP  137/73 (BP Location: Right Arm)   Pulse 84   Temp 99.7 F (37.6 C) (Oral)   Resp 18   SpO2 99%   Physical Exam  Constitutional: She is oriented to person, place, and time. She appears well-developed and well-nourished. No distress.  HENT:  Head: Normocephalic and atraumatic.  Moist mucous membranes  Eyes: Conjunctivae are normal.  Neck: Neck supple.  Cardiovascular: Normal rate, regular rhythm and normal heart sounds.  No murmur heard. Pulmonary/Chest:  Increased WOB with tachypnea and frequent cough, no respiratory distress; diminished breath sounds in all lung fields  Abdominal: Soft. Bowel sounds are normal. She exhibits no distension. There is no tenderness.  Musculoskeletal:       Right lower leg: She exhibits edema.       Left lower leg: She exhibits edema.  Trace BLE edema  Neurological: She is alert and oriented to person, place, and time.  Fluent speech  Skin: Skin is warm and dry.  Psychiatric: She has a normal mood and affect. Judgment normal.  Nursing note and vitals reviewed.    ED Treatments / Results  Labs (all labs ordered are listed, but only abnormal results are displayed) Labs Reviewed  BASIC METABOLIC PANEL - Abnormal; Notable for the following components:      Result Value   Sodium 134 (*)    Chloride 99 (*)    Glucose, Bld 128 (*)    All other components within normal limits  CBC - Abnormal; Notable for the following components:   WBC 11.6 (*)    All other components within normal limits    EKG EKG Interpretation  Date/Time:  Wednesday December 03 2017 22:35:50 EDT Ventricular Rate:  88 PR Interval:    QRS Duration: 98 QT Interval:  358 QTC Calculation: 434 R Axis:   44 Text Interpretation:  Sinus rhythm Low voltage, precordial leads Baseline wander in lead(s) I II aVR No previous ECGs available Confirmed by Frederick PeersLittle, Rachel [phone]([id number]) on 12/03/2017 10:39:43 PM   Radiology Dg Chest Port 1 View  Result Date: 12/03/2017 CLINICAL DATA:  Initial  evaluation for acute shortness of breath, cough, history of asthma. EXAM: PORTABLE CHEST 1 VIEW COMPARISON:  None. FINDINGS: The cardiac and mediastinal silhouettes are within normal limits. The lungs are normally inflated. No significant peribronchial thickening. No airspace consolidation, pleural effusion, or pulmonary edema is identified. There is no pneumothorax. No acute osseous abnormality identified. IMPRESSION: No radiographic evidence for active cardiopulmonary disease. Electronically Signed   By: Rise MuBenjamin  McClintock M.D.   On: 12/03/2017 22:59    Procedures Procedures (including critical care time)  Medications Ordered in ED Medications  ipratropium-albuterol (DUONEB) 0.5-2.5 (3) MG/3ML nebulizer solution 3 mL (3 mLs Nebulization Given 12/03/17 2251)  methylPREDNISolone sodium succinate (SOLU-MEDROL) 125 mg/2 mL injection 125 mg (125 mg Intravenous Given 12/03/17 2305)  magnesium sulfate IVPB 2 g 50 mL (2 g Intravenous New Bag/Given 12/03/17 2306)     Initial Impression / Assessment and Plan / ED Course  I have  reviewed the triage vital signs and the nursing notes.  Pertinent labs & imaging results that were available during my care of the patient were reviewed by me and considered in my medical decision making (see chart for details).    PT had frequent cough, increased WOB on exam, reassuring VS. Diminished b/l. EKG non-ischemic. Gave duoneb, Mg, solumedrol.   Lab work reassuring and chest x-ray clear.  On reassessment, the patient continued to cough but had much improved work of breathing with normal respiratory rate and no accessory muscle use.  Normal O2 saturation on room air.  She states she feels much better.  Improved air movement on repeat exam.  I provided with albuterol to use at home as well as cough medication.  Provided with prednisone and cautioned that it may cause hyperglycemia.  Instructed to see PCP for reassessment.  Discussed supportive measures, extensively reviewed  return precautions.  She has been voiced understanding and she was discharged in satisfactory condition.  Final Clinical Impressions(s) / ED Diagnoses   Final diagnoses:  Mild intermittent asthma with exacerbation  Viral upper respiratory tract infection    ED Discharge Orders        Ordered    albuterol (PROVENTIL) (2.5 MG/3ML) 0.083% nebulizer solution  Every 6 hours PRN     12/04/17 0008    predniSONE (DELTASONE) 20 MG tablet  Daily     12/04/17 0008    promethazine-dextromethorphan (PROMETHAZINE-DM) 6.25-15 MG/5ML syrup  Every 8 hours PRN     12/04/17 0008       Little, Ambrose Finland, MD 12/04/17 0010

## 2017-12-03 NOTE — ED Triage Notes (Signed)
Pt complains of being short of breath for two days, last breathing treatment an hour ago with no relief

## 2017-12-04 MED ORDER — PREDNISONE 20 MG PO TABS: 40.0000 mg | ORAL_TABLET | Freq: Every day | ORAL | 0 refills | Status: DC

## 2017-12-04 MED ORDER — ALBUTEROL SULFATE (2.5 MG/3ML) 0.083% IN NEBU: 2.5000 mg | INHALATION_SOLUTION | Freq: Four times a day (QID) | RESPIRATORY_TRACT | 0 refills | Status: DC | PRN

## 2017-12-04 MED ORDER — PROMETHAZINE-DM 6.25-15 MG/5ML PO SYRP: 5.0000 mL | ORAL_SOLUTION | Freq: Three times a day (TID) | ORAL | 0 refills | Status: AC | PRN

## 2017-12-04 MED ORDER — PROMETHAZINE-DM 6.25-15 MG/5ML PO SYRP: 5.0000 mL | ORAL_SOLUTION | Freq: Three times a day (TID) | ORAL | 0 refills | Status: DC | PRN

## 2017-12-04 NOTE — ED Provider Notes (Signed)
Patient was seen and evaluated by Dr. Clarene DukeLittle who will discharge the patient but forgot to sign prescriptions.  I reprinted prescriptions and signed in for the patient.   Nira Connardama, Morrison Masser Eduardo, MD 12/04/17 [phone]

## 2017-12-10 DIAGNOSIS — J45901 Unspecified asthma with (acute) exacerbation: Secondary | ICD-10-CM | POA: Diagnosis not present

## 2017-12-10 DIAGNOSIS — J45909 Unspecified asthma, uncomplicated: Secondary | ICD-10-CM | POA: Diagnosis not present

## 2017-12-11 DIAGNOSIS — M545 Low back pain: Secondary | ICD-10-CM | POA: Diagnosis not present

## 2017-12-19 DIAGNOSIS — M545 Low back pain: Secondary | ICD-10-CM | POA: Diagnosis not present

## 2018-01-20 DIAGNOSIS — H2513 Age-related nuclear cataract, bilateral: Secondary | ICD-10-CM | POA: Diagnosis not present

## 2018-01-20 DIAGNOSIS — H401131 Primary open-angle glaucoma, bilateral, mild stage: Secondary | ICD-10-CM | POA: Diagnosis not present

## 2018-01-20 DIAGNOSIS — H01024 Squamous blepharitis left upper eyelid: Secondary | ICD-10-CM | POA: Diagnosis not present

## 2018-01-20 DIAGNOSIS — H10413 Chronic giant papillary conjunctivitis, bilateral: Secondary | ICD-10-CM | POA: Diagnosis not present

## 2018-01-20 DIAGNOSIS — H01021 Squamous blepharitis right upper eyelid: Secondary | ICD-10-CM | POA: Diagnosis not present

## 2018-01-20 DIAGNOSIS — H01022 Squamous blepharitis right lower eyelid: Secondary | ICD-10-CM | POA: Diagnosis not present

## 2018-01-20 DIAGNOSIS — H01025 Squamous blepharitis left lower eyelid: Secondary | ICD-10-CM | POA: Diagnosis not present

## 2018-01-20 DIAGNOSIS — E119 Type 2 diabetes mellitus without complications: Secondary | ICD-10-CM | POA: Diagnosis not present

## 2018-02-04 DIAGNOSIS — E039 Hypothyroidism, unspecified: Secondary | ICD-10-CM | POA: Diagnosis not present

## 2018-02-04 DIAGNOSIS — E1169 Type 2 diabetes mellitus with other specified complication: Secondary | ICD-10-CM | POA: Diagnosis not present

## 2018-02-04 DIAGNOSIS — E7800 Pure hypercholesterolemia, unspecified: Secondary | ICD-10-CM | POA: Diagnosis not present

## 2018-02-04 DIAGNOSIS — J45909 Unspecified asthma, uncomplicated: Secondary | ICD-10-CM | POA: Diagnosis not present

## 2018-02-04 DIAGNOSIS — F325 Major depressive disorder, single episode, in full remission: Secondary | ICD-10-CM | POA: Diagnosis not present

## 2018-02-24 DIAGNOSIS — T887XXA Unspecified adverse effect of drug or medicament, initial encounter: Secondary | ICD-10-CM | POA: Diagnosis not present

## 2018-02-24 DIAGNOSIS — R300 Dysuria: Secondary | ICD-10-CM | POA: Diagnosis not present

## 2018-02-24 DIAGNOSIS — E1169 Type 2 diabetes mellitus with other specified complication: Secondary | ICD-10-CM | POA: Diagnosis not present

## 2018-02-24 DIAGNOSIS — N762 Acute vulvitis: Secondary | ICD-10-CM | POA: Diagnosis not present

## 2018-02-25 DIAGNOSIS — H10413 Chronic giant papillary conjunctivitis, bilateral: Secondary | ICD-10-CM | POA: Diagnosis not present

## 2018-02-25 DIAGNOSIS — H01021 Squamous blepharitis right upper eyelid: Secondary | ICD-10-CM | POA: Diagnosis not present

## 2018-02-25 DIAGNOSIS — H01025 Squamous blepharitis left lower eyelid: Secondary | ICD-10-CM | POA: Diagnosis not present

## 2018-02-25 DIAGNOSIS — H01024 Squamous blepharitis left upper eyelid: Secondary | ICD-10-CM | POA: Diagnosis not present

## 2018-02-25 DIAGNOSIS — H401131 Primary open-angle glaucoma, bilateral, mild stage: Secondary | ICD-10-CM | POA: Diagnosis not present

## 2018-02-25 DIAGNOSIS — H01022 Squamous blepharitis right lower eyelid: Secondary | ICD-10-CM | POA: Diagnosis not present

## 2018-02-26 DIAGNOSIS — E1165 Type 2 diabetes mellitus with hyperglycemia: Secondary | ICD-10-CM | POA: Diagnosis not present

## 2018-02-26 DIAGNOSIS — N760 Acute vaginitis: Secondary | ICD-10-CM | POA: Diagnosis not present

## 2018-03-09 ENCOUNTER — Encounter: Payer: Self-pay | Admitting: Gynecology

## 2018-03-09 ENCOUNTER — Ambulatory Visit (INDEPENDENT_AMBULATORY_CARE_PROVIDER_SITE_OTHER): Payer: Medicare Other | Admitting: Gynecology

## 2018-03-09 VITALS — BP 128/86 | Ht <= 58 in | Wt 244.0 lb

## 2018-03-09 DIAGNOSIS — N952 Postmenopausal atrophic vaginitis: Secondary | ICD-10-CM

## 2018-03-09 DIAGNOSIS — Z01419 Encounter for gynecological examination (general) (routine) without abnormal findings: Secondary | ICD-10-CM | POA: Diagnosis not present

## 2018-03-09 NOTE — Addendum Note (Signed)
Addended by: Berna Spare A on: 03/09/2018 04:26 PM   Modules accepted: Orders

## 2018-03-09 NOTE — Addendum Note (Signed)
Addended by: Berna Spare A on: 03/09/2018 10:44 AM   Modules accepted: Orders

## 2018-03-09 NOTE — Progress Notes (Signed)
    Katina Szatkowski [birth year] [id number]        67 y.o.  G2P2 new patient for breast and pelvic exam.  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  No history of abnormal Pap smears.  Past medical history,surgical history, problem list, medications, allergies, family history and social history were all reviewed and documented as reviewed in the EPIC chart.  ROS:  Performed with pertinent positives and negatives included in the history, assessment and plan.   Additional significant findings : None   Exam: Biomedical scientist Vitals:   03/09/18 0958  BP: 128/86  Weight: 244 lb (110.7 kg)  Height: 4\' 10"  (1.473 m)   Body mass index is 51 kg/m.  General appearance:  Normal affect, orientation and appearance. Skin: Grossly normal HEENT: Without gross lesions.  No cervical or supraclavicular adenopathy. Thyroid normal.  Lungs:  Clear without wheezing, rales or rhonchi Cardiac: RR, without RMG Abdominal:  Soft, nontender, without masses, guarding, rebound, organomegaly or hernia Breasts:  Examined lying and sitting without masses, retractions, discharge or axillary adenopathy. Pelvic:  Ext, BUS, Vagina: With atrophic changes.  Pap smear of vaginal cuff done  Adnexa: Without masses or tenderness    Anus and perineum: Normal   Rectovaginal: Normal sphincter tone without palpated masses or tenderness.    Assessment/Plan:  67 y.o. G2P2 female for breast and pelvic exam.   1. Postmenopausal/atrophic genital changes.  No significant postmenopausal symptoms.  Status post vaginal hysterectomy historically for prolapsing myoma. 2. Pap smear reported 5 years ago.  Pap smear of vaginal cuff done today as I have no records of her prior Pap smears.  She has no history of abnormal Pap smears. 3. Mammography December 2018.  Continue with annual mammography when due.  Breast exam normal today. 4. Colonoscopy 2 years ago.  Repeat at  their recommended interval. 5. DEXA several years ago.  Recommend baseline DEXA now as she is 67.  Patient will schedule in follow-up for this. 6. Health maintenance.  No routine lab work done as patient does this elsewhere.  Follow-up 1 year, sooner as needed.   Dara Lords MD, 10:26 AM 03/09/2018

## 2018-03-09 NOTE — Patient Instructions (Signed)
Followup for bone density as scheduled. 

## 2018-03-10 DIAGNOSIS — H01022 Squamous blepharitis right lower eyelid: Secondary | ICD-10-CM | POA: Diagnosis not present

## 2018-03-10 DIAGNOSIS — H01025 Squamous blepharitis left lower eyelid: Secondary | ICD-10-CM | POA: Diagnosis not present

## 2018-03-10 DIAGNOSIS — E119 Type 2 diabetes mellitus without complications: Secondary | ICD-10-CM | POA: Diagnosis not present

## 2018-03-10 DIAGNOSIS — H401131 Primary open-angle glaucoma, bilateral, mild stage: Secondary | ICD-10-CM | POA: Diagnosis not present

## 2018-03-10 DIAGNOSIS — H01024 Squamous blepharitis left upper eyelid: Secondary | ICD-10-CM | POA: Diagnosis not present

## 2018-03-10 DIAGNOSIS — H01021 Squamous blepharitis right upper eyelid: Secondary | ICD-10-CM | POA: Diagnosis not present

## 2018-03-10 DIAGNOSIS — H10413 Chronic giant papillary conjunctivitis, bilateral: Secondary | ICD-10-CM | POA: Diagnosis not present

## 2018-03-10 DIAGNOSIS — H2513 Age-related nuclear cataract, bilateral: Secondary | ICD-10-CM | POA: Diagnosis not present

## 2018-03-10 LAB — PAP IG W/ RFLX HPV ASCU

## 2018-03-30 DIAGNOSIS — E1169 Type 2 diabetes mellitus with other specified complication: Secondary | ICD-10-CM | POA: Diagnosis not present

## 2018-04-01 DIAGNOSIS — H01024 Squamous blepharitis left upper eyelid: Secondary | ICD-10-CM | POA: Diagnosis not present

## 2018-04-01 DIAGNOSIS — H01021 Squamous blepharitis right upper eyelid: Secondary | ICD-10-CM | POA: Diagnosis not present

## 2018-04-01 DIAGNOSIS — E119 Type 2 diabetes mellitus without complications: Secondary | ICD-10-CM | POA: Diagnosis not present

## 2018-04-01 DIAGNOSIS — H10413 Chronic giant papillary conjunctivitis, bilateral: Secondary | ICD-10-CM | POA: Diagnosis not present

## 2018-04-01 DIAGNOSIS — H01022 Squamous blepharitis right lower eyelid: Secondary | ICD-10-CM | POA: Diagnosis not present

## 2018-04-01 DIAGNOSIS — H401131 Primary open-angle glaucoma, bilateral, mild stage: Secondary | ICD-10-CM | POA: Diagnosis not present

## 2018-04-01 DIAGNOSIS — H2513 Age-related nuclear cataract, bilateral: Secondary | ICD-10-CM | POA: Diagnosis not present

## 2018-04-01 DIAGNOSIS — H01025 Squamous blepharitis left lower eyelid: Secondary | ICD-10-CM | POA: Diagnosis not present

## 2018-04-07 DIAGNOSIS — H01024 Squamous blepharitis left upper eyelid: Secondary | ICD-10-CM | POA: Diagnosis not present

## 2018-04-07 DIAGNOSIS — H2513 Age-related nuclear cataract, bilateral: Secondary | ICD-10-CM | POA: Diagnosis not present

## 2018-04-07 DIAGNOSIS — H01025 Squamous blepharitis left lower eyelid: Secondary | ICD-10-CM | POA: Diagnosis not present

## 2018-04-07 DIAGNOSIS — H01021 Squamous blepharitis right upper eyelid: Secondary | ICD-10-CM | POA: Diagnosis not present

## 2018-04-07 DIAGNOSIS — E119 Type 2 diabetes mellitus without complications: Secondary | ICD-10-CM | POA: Diagnosis not present

## 2018-04-07 DIAGNOSIS — H10413 Chronic giant papillary conjunctivitis, bilateral: Secondary | ICD-10-CM | POA: Diagnosis not present

## 2018-04-07 DIAGNOSIS — H401131 Primary open-angle glaucoma, bilateral, mild stage: Secondary | ICD-10-CM | POA: Diagnosis not present

## 2018-04-07 DIAGNOSIS — H01022 Squamous blepharitis right lower eyelid: Secondary | ICD-10-CM | POA: Diagnosis not present

## 2018-04-12 DIAGNOSIS — B379 Candidiasis, unspecified: Secondary | ICD-10-CM | POA: Diagnosis not present

## 2018-04-13 ENCOUNTER — Encounter: Payer: Self-pay | Admitting: Gynecology

## 2018-04-13 ENCOUNTER — Ambulatory Visit (INDEPENDENT_AMBULATORY_CARE_PROVIDER_SITE_OTHER): Payer: Medicare Other

## 2018-04-13 ENCOUNTER — Other Ambulatory Visit: Payer: Self-pay | Admitting: Gynecology

## 2018-04-13 DIAGNOSIS — Z01419 Encounter for gynecological examination (general) (routine) without abnormal findings: Secondary | ICD-10-CM

## 2018-04-13 DIAGNOSIS — Z90710 Acquired absence of both cervix and uterus: Secondary | ICD-10-CM | POA: Diagnosis not present

## 2018-04-13 DIAGNOSIS — Z780 Asymptomatic menopausal state: Secondary | ICD-10-CM

## 2018-04-13 DIAGNOSIS — N951 Menopausal and female climacteric states: Secondary | ICD-10-CM

## 2018-04-24 DIAGNOSIS — H01021 Squamous blepharitis right upper eyelid: Secondary | ICD-10-CM | POA: Diagnosis not present

## 2018-04-24 DIAGNOSIS — H10413 Chronic giant papillary conjunctivitis, bilateral: Secondary | ICD-10-CM | POA: Diagnosis not present

## 2018-04-24 DIAGNOSIS — H401131 Primary open-angle glaucoma, bilateral, mild stage: Secondary | ICD-10-CM | POA: Diagnosis not present

## 2018-04-24 DIAGNOSIS — H2513 Age-related nuclear cataract, bilateral: Secondary | ICD-10-CM | POA: Diagnosis not present

## 2018-04-24 DIAGNOSIS — H01022 Squamous blepharitis right lower eyelid: Secondary | ICD-10-CM | POA: Diagnosis not present

## 2018-04-24 DIAGNOSIS — H01024 Squamous blepharitis left upper eyelid: Secondary | ICD-10-CM | POA: Diagnosis not present

## 2018-04-24 DIAGNOSIS — E119 Type 2 diabetes mellitus without complications: Secondary | ICD-10-CM | POA: Diagnosis not present

## 2018-04-24 DIAGNOSIS — H01025 Squamous blepharitis left lower eyelid: Secondary | ICD-10-CM | POA: Diagnosis not present

## 2018-04-27 ENCOUNTER — Other Ambulatory Visit: Payer: Self-pay | Admitting: Gynecology

## 2018-04-27 DIAGNOSIS — Z1231 Encounter for screening mammogram for malignant neoplasm of breast: Secondary | ICD-10-CM

## 2018-05-27 DIAGNOSIS — H01024 Squamous blepharitis left upper eyelid: Secondary | ICD-10-CM | POA: Diagnosis not present

## 2018-05-27 DIAGNOSIS — E119 Type 2 diabetes mellitus without complications: Secondary | ICD-10-CM | POA: Diagnosis not present

## 2018-05-27 DIAGNOSIS — H01021 Squamous blepharitis right upper eyelid: Secondary | ICD-10-CM | POA: Diagnosis not present

## 2018-05-27 DIAGNOSIS — H01022 Squamous blepharitis right lower eyelid: Secondary | ICD-10-CM | POA: Diagnosis not present

## 2018-05-27 DIAGNOSIS — H10413 Chronic giant papillary conjunctivitis, bilateral: Secondary | ICD-10-CM | POA: Diagnosis not present

## 2018-05-27 DIAGNOSIS — H01025 Squamous blepharitis left lower eyelid: Secondary | ICD-10-CM | POA: Diagnosis not present

## 2018-05-27 DIAGNOSIS — H401131 Primary open-angle glaucoma, bilateral, mild stage: Secondary | ICD-10-CM | POA: Diagnosis not present

## 2018-05-27 DIAGNOSIS — H2513 Age-related nuclear cataract, bilateral: Secondary | ICD-10-CM | POA: Diagnosis not present

## 2018-06-08 ENCOUNTER — Ambulatory Visit
Admission: RE | Admit: 2018-06-08 | Discharge: 2018-06-08 | Disposition: A | Discharge status: Home / Self Care | Payer: Medicare Other | Source: Ambulatory Visit | Attending: Gynecology | Admitting: Gynecology

## 2018-06-08 DIAGNOSIS — Z1231 Encounter for screening mammogram for malignant neoplasm of breast: Secondary | ICD-10-CM | POA: Diagnosis not present

## 2018-06-10 ENCOUNTER — Other Ambulatory Visit: Payer: Self-pay | Admitting: Gynecology

## 2018-06-10 DIAGNOSIS — R928 Other abnormal and inconclusive findings on diagnostic imaging of breast: Secondary | ICD-10-CM

## 2018-06-15 ENCOUNTER — Other Ambulatory Visit: Payer: Self-pay | Admitting: Gynecology

## 2018-06-15 ENCOUNTER — Ambulatory Visit
Admission: RE | Admit: 2018-06-15 | Discharge: 2018-06-15 | Disposition: A | Discharge status: Home / Self Care | Payer: Medicare Other | Source: Ambulatory Visit | Attending: Gynecology | Admitting: Gynecology

## 2018-06-15 DIAGNOSIS — R921 Mammographic calcification found on diagnostic imaging of breast: Secondary | ICD-10-CM | POA: Diagnosis not present

## 2018-06-15 DIAGNOSIS — R928 Other abnormal and inconclusive findings on diagnostic imaging of breast: Secondary | ICD-10-CM

## 2018-06-17 ENCOUNTER — Ambulatory Visit
Admission: RE | Admit: 2018-06-17 | Discharge: 2018-06-17 | Disposition: A | Discharge status: Home / Self Care | Payer: Medicare Other | Source: Ambulatory Visit | Attending: Gynecology | Admitting: Gynecology

## 2018-06-17 ENCOUNTER — Other Ambulatory Visit: Payer: Self-pay | Admitting: Gynecology

## 2018-06-17 DIAGNOSIS — R928 Other abnormal and inconclusive findings on diagnostic imaging of breast: Secondary | ICD-10-CM

## 2018-06-17 DIAGNOSIS — R921 Mammographic calcification found on diagnostic imaging of breast: Secondary | ICD-10-CM | POA: Diagnosis not present

## 2018-06-17 DIAGNOSIS — R920 Mammographic microcalcification found on diagnostic imaging of breast: Secondary | ICD-10-CM | POA: Diagnosis not present

## 2018-06-17 DIAGNOSIS — N6011 Diffuse cystic mastopathy of right breast: Secondary | ICD-10-CM | POA: Diagnosis not present

## 2018-07-06 DIAGNOSIS — E039 Hypothyroidism, unspecified: Secondary | ICD-10-CM | POA: Diagnosis not present

## 2018-07-06 DIAGNOSIS — Z23 Encounter for immunization: Secondary | ICD-10-CM | POA: Diagnosis not present

## 2018-07-06 DIAGNOSIS — E7800 Pure hypercholesterolemia, unspecified: Secondary | ICD-10-CM | POA: Diagnosis not present

## 2018-07-06 DIAGNOSIS — J45909 Unspecified asthma, uncomplicated: Secondary | ICD-10-CM | POA: Diagnosis not present

## 2018-07-06 DIAGNOSIS — F324 Major depressive disorder, single episode, in partial remission: Secondary | ICD-10-CM | POA: Diagnosis not present

## 2018-07-06 DIAGNOSIS — E1169 Type 2 diabetes mellitus with other specified complication: Secondary | ICD-10-CM | POA: Diagnosis not present

## 2018-07-06 DIAGNOSIS — I700 Atherosclerosis of aorta: Secondary | ICD-10-CM | POA: Diagnosis not present

## 2018-08-24 DIAGNOSIS — R05 Cough: Secondary | ICD-10-CM | POA: Diagnosis not present

## 2018-08-24 DIAGNOSIS — J4531 Mild persistent asthma with (acute) exacerbation: Secondary | ICD-10-CM | POA: Diagnosis not present

## 2018-08-24 DIAGNOSIS — R0989 Other specified symptoms and signs involving the circulatory and respiratory systems: Secondary | ICD-10-CM | POA: Diagnosis not present

## 2018-08-24 DIAGNOSIS — E1165 Type 2 diabetes mellitus with hyperglycemia: Secondary | ICD-10-CM | POA: Diagnosis not present

## 2018-08-26 DIAGNOSIS — R0989 Other specified symptoms and signs involving the circulatory and respiratory systems: Secondary | ICD-10-CM | POA: Diagnosis not present

## 2018-08-26 DIAGNOSIS — R05 Cough: Secondary | ICD-10-CM | POA: Diagnosis not present

## 2018-08-26 DIAGNOSIS — J4531 Mild persistent asthma with (acute) exacerbation: Secondary | ICD-10-CM | POA: Diagnosis not present

## 2018-09-07 DIAGNOSIS — H0102A Squamous blepharitis right eye, upper and lower eyelids: Secondary | ICD-10-CM | POA: Diagnosis not present

## 2018-09-07 DIAGNOSIS — H401131 Primary open-angle glaucoma, bilateral, mild stage: Secondary | ICD-10-CM | POA: Diagnosis not present

## 2018-09-07 DIAGNOSIS — H2513 Age-related nuclear cataract, bilateral: Secondary | ICD-10-CM | POA: Diagnosis not present

## 2018-09-07 DIAGNOSIS — E119 Type 2 diabetes mellitus without complications: Secondary | ICD-10-CM | POA: Diagnosis not present

## 2018-09-07 DIAGNOSIS — H0102B Squamous blepharitis left eye, upper and lower eyelids: Secondary | ICD-10-CM | POA: Diagnosis not present

## 2018-09-07 DIAGNOSIS — H10413 Chronic giant papillary conjunctivitis, bilateral: Secondary | ICD-10-CM | POA: Diagnosis not present

## 2018-10-01 DIAGNOSIS — F324 Major depressive disorder, single episode, in partial remission: Secondary | ICD-10-CM | POA: Diagnosis not present

## 2018-10-01 DIAGNOSIS — J45909 Unspecified asthma, uncomplicated: Secondary | ICD-10-CM | POA: Diagnosis not present

## 2018-10-01 DIAGNOSIS — E1169 Type 2 diabetes mellitus with other specified complication: Secondary | ICD-10-CM | POA: Diagnosis not present

## 2018-10-01 DIAGNOSIS — E039 Hypothyroidism, unspecified: Secondary | ICD-10-CM | POA: Diagnosis not present

## 2018-10-01 DIAGNOSIS — Z0000 Encounter for general adult medical examination without abnormal findings: Secondary | ICD-10-CM | POA: Diagnosis not present

## 2018-10-01 DIAGNOSIS — I700 Atherosclerosis of aorta: Secondary | ICD-10-CM | POA: Diagnosis not present

## 2018-10-01 DIAGNOSIS — G4733 Obstructive sleep apnea (adult) (pediatric): Secondary | ICD-10-CM | POA: Diagnosis not present

## 2018-10-01 DIAGNOSIS — E782 Mixed hyperlipidemia: Secondary | ICD-10-CM | POA: Diagnosis not present

## 2018-10-01 DIAGNOSIS — Z1389 Encounter for screening for other disorder: Secondary | ICD-10-CM | POA: Diagnosis not present

## 2018-10-07 DIAGNOSIS — G4733 Obstructive sleep apnea (adult) (pediatric): Secondary | ICD-10-CM | POA: Diagnosis not present

## 2018-11-03 DIAGNOSIS — E119 Type 2 diabetes mellitus without complications: Secondary | ICD-10-CM | POA: Diagnosis not present

## 2018-11-03 DIAGNOSIS — H0102A Squamous blepharitis right eye, upper and lower eyelids: Secondary | ICD-10-CM | POA: Diagnosis not present

## 2018-11-03 DIAGNOSIS — H2513 Age-related nuclear cataract, bilateral: Secondary | ICD-10-CM | POA: Diagnosis not present

## 2018-11-03 DIAGNOSIS — H0102B Squamous blepharitis left eye, upper and lower eyelids: Secondary | ICD-10-CM | POA: Diagnosis not present

## 2018-11-03 DIAGNOSIS — H10413 Chronic giant papillary conjunctivitis, bilateral: Secondary | ICD-10-CM | POA: Diagnosis not present

## 2018-11-03 DIAGNOSIS — H401131 Primary open-angle glaucoma, bilateral, mild stage: Secondary | ICD-10-CM | POA: Diagnosis not present

## 2018-11-19 DIAGNOSIS — H401121 Primary open-angle glaucoma, left eye, mild stage: Secondary | ICD-10-CM | POA: Diagnosis not present

## 2018-11-19 DIAGNOSIS — H2512 Age-related nuclear cataract, left eye: Secondary | ICD-10-CM | POA: Diagnosis not present

## 2018-12-01 DIAGNOSIS — Z961 Presence of intraocular lens: Secondary | ICD-10-CM | POA: Diagnosis not present

## 2018-12-01 DIAGNOSIS — H2511 Age-related nuclear cataract, right eye: Secondary | ICD-10-CM | POA: Diagnosis not present

## 2018-12-01 DIAGNOSIS — H401131 Primary open-angle glaucoma, bilateral, mild stage: Secondary | ICD-10-CM | POA: Diagnosis not present

## 2018-12-03 DIAGNOSIS — H2511 Age-related nuclear cataract, right eye: Secondary | ICD-10-CM | POA: Diagnosis not present

## 2018-12-03 DIAGNOSIS — H52211 Irregular astigmatism, right eye: Secondary | ICD-10-CM | POA: Diagnosis not present

## 2018-12-03 DIAGNOSIS — H401111 Primary open-angle glaucoma, right eye, mild stage: Secondary | ICD-10-CM | POA: Diagnosis not present

## 2019-01-07 DIAGNOSIS — M5431 Sciatica, right side: Secondary | ICD-10-CM | POA: Diagnosis not present

## 2019-01-20 DIAGNOSIS — M25551 Pain in right hip: Secondary | ICD-10-CM | POA: Diagnosis not present

## 2019-02-02 DIAGNOSIS — M5431 Sciatica, right side: Secondary | ICD-10-CM | POA: Diagnosis not present

## 2019-02-15 ENCOUNTER — Encounter: Payer: Self-pay | Admitting: Physical Therapy

## 2019-02-15 ENCOUNTER — Other Ambulatory Visit: Payer: Self-pay

## 2019-02-15 ENCOUNTER — Ambulatory Visit: Payer: Medicare Other | Attending: Internal Medicine | Admitting: Physical Therapy

## 2019-02-15 DIAGNOSIS — M5441 Lumbago with sciatica, right side: Secondary | ICD-10-CM | POA: Diagnosis not present

## 2019-02-15 DIAGNOSIS — M62830 Muscle spasm of back: Secondary | ICD-10-CM | POA: Insufficient documentation

## 2019-02-15 NOTE — Therapy (Signed)
[facility] [address] [place], [place], [address] Phone: [phone]   Fax:  [phone]  Physical Therapy Evaluation  Patient Details  Name: Kayla Ray MRN: [id number] Date of Birth: 08/28/1950 Referring Provider (PT): Kirby FunkGriffin, John   Encounter Date: 02/15/2019  PT End of Session - 02/15/19 1430    Visit Number  1    Date for PT Re-Evaluation  04/17/19    PT Start Time  1400    PT Stop Time  1450    PT Time Calculation (min)  50 min    Activity Tolerance  Patient tolerated treatment well    Behavior During Therapy  [facility] for tasks assessed/performed       Past Medical History:  Diagnosis Date  . Asthma   . Diabetes mellitus without complication [facility](HCC)     Past Surgical History:  Procedure Laterality Date  . ABDOMINAL HYSTERECTOMY     vaginal hysterectomy with ovarian preservation   . CESAREAN SECTION     x2  . GALLBLADDER SURGERY      There were no vitals filed for this visit.   Subjective Assessment - 02/15/19 1400    Subjective  Patient reports that she woke up on June 26th with pain in the low back and into the right leg.  She reports that she thinks due to the pandemic she has been sitting a lot more.  She reports that she had this last year and it got better with PT.   She has had a round of prednisone and two other pain medications.  She reports having difficulty going up stairs due to weakness int he right leg.    Limitations  Lifting;Standing;Walking    Diagnostic tests  none    Patient Stated Goals  have less pain    Currently in Pain?  Yes    Pain Score  2     Pain Location  Back    Pain Orientation  Right;Lower    Pain Descriptors / Indicators  Aching;Dull    Pain Type  Acute pain    Pain Radiating Towards  pain down the right buttock to the right knee    Pain Onset  More than a month ago    Pain Frequency  Constant    Aggravating Factors   sitting, standing, bending pain up to 10/10     Pain Relieving Factors  pain medicationrest helped some, now is good a 3/10    Effect of Pain on Daily Activities  limits everything when it hurts         [facility] PT Assessment - 02/15/19 0001      Assessment   Medical Diagnosis  LBP with right sciatica    Referring Provider (PT)  Kirby FunkGriffin, John    Onset Date/Surgical Date  01/15/19    Prior Therapy  last year      Precautions   Precautions  None      Balance Screen   Has the patient fallen in the past 6 months  No    Has the patient had a decrease in activity level because of a fear of falling?   No    Is the patient reluctant to leave their home because of a fear of falling?   No      Home Environment   Additional Comments  no stairs, does some light housework      Prior Function   Level of Independence  Independent  Vocation  Retired    Leisure  no exercise      Mining engineer Comments  obese      ROM / Strength   AROM / PROM / Strength  AROM;Strength      AROM   Overall AROM Comments  Lumbar ROM decreased 25% with some light pain      Strength   Overall Strength Comments  right LE 4-/5, left 4/5, ankles 4-/5,       Flexibility   Soft Tissue Assessment /Muscle Length  yes    Hamstrings  good    ITB  tight    Piriformis  tight      Palpation   Palpation comment  she is very tight in the lumbar area, she is non tender until into the buttock and in the right ITB area      Bed Mobility   Bed Mobility  --   difficulty and gaurded with transfers and bed mobility     Ambulation/Gait   Gait Comments  antalgic on the right                Objective measurements completed on examination: See above findings.      [facility] Adult PT Treatment/Exercise - 02/15/19 0001      Modalities   Modalities  Electrical Stimulation;Moist Heat      Moist Heat Therapy   Number Minutes Moist Heat  13 Minutes    Moist Heat Location  Lumbar Spine      Electrical Stimulation   Electrical  Stimulation Location  right low back into the buttock    Electrical Stimulation Action  IFC    Electrical Stimulation Parameters  supine    Electrical Stimulation Goals  Pain             PT Education - 02/15/19 1429    Education Details  piriformis stretch    Person(s) Educated  Patient    Methods  Explanation;Demonstration;Tactile cues;Verbal cues;Handout    Comprehension  Verbalized understanding;Returned demonstration;Verbal cues required       PT Short Term Goals - 02/15/19 1435      PT SHORT TERM GOAL #1   Title  independent with initial HEP    Time  2    Period  Weeks    Status  New        PT Long Term Goals - 02/15/19 1437      PT LONG TERM GOAL #1   Title  understand postue and body mechanics    Time  8    Period  Weeks    Status  New      PT LONG TERM GOAL #2   Title  increase lumbar ROM to WFL's    Time  8    Period  Weeks    Status  New      PT LONG TERM GOAL #3   Title  decrease pain 50%    Time  8    Period  Weeks    Status  New      PT LONG TERM GOAL #4   Title  independent with an advanced HEP    Time  8    Period  Weeks    Status  New             Plan - 02/15/19 1432    Clinical Impression Statement  Patient reports that she started having pain in June she thinks it is due to her sitting  around a lot due to covid.  She reports that she had this last year and it got better with some PT, she reports that she started with prednisone and other medications and has tried to do the stretches from PT but it won't go away.  Her lumbar motions is limited a little, she is very tight to palaption in the low back and th ebuttocks, tender in the buttock and the right ITB.  She was tight piriformis and tender having difficulty with the stretches    Stability/Clinical Decision Making  Evolving/Moderate complexity    Clinical Decision Making  Low    Rehab Potential  Good    PT Frequency  2x / week    PT Duration  8 weeks    PT  Treatment/Interventions  ADLs/Self Care Home Management;Cryotherapy;Electrical Stimulation;Iontophoresis 4mg /ml Dexamethasone;Moist Heat;Ultrasound;Therapeutic activities;Therapeutic exercise;Neuromuscular re-education;Patient/family education;Manual techniques;Dry needling    PT Next Visit Plan  slowly start exercises    Consulted and Agree with Plan of Care  Patient       Patient will benefit from skilled therapeutic intervention in order to improve the following deficits and impairments:  Abnormal gait, Pain, Improper body mechanics, Postural dysfunction, Increased muscle spasms, Decreased mobility, Decreased activity tolerance, Decreased range of motion, Decreased strength, Difficulty walking, Impaired flexibility  Visit Diagnosis: 1. Acute right-sided low back pain with right-sided sciatica   2. Muscle spasm of back        Problem List There are no active problems to display for this patient.   Jearld LeschALBRIGHT,Shivaun Bilello W., PT 02/15/2019, 2:44 PM  [facility] [address] [place], [place], [address] Phone: [phone]   Fax:  [phone]  Name: Kayla Ray MRN: [id number] Date of Birth: 09/01/1950

## 2019-02-18 ENCOUNTER — Encounter: Payer: Self-pay | Admitting: Physical Therapy

## 2019-02-18 ENCOUNTER — Ambulatory Visit: Payer: Medicare Other | Admitting: Physical Therapy

## 2019-02-18 ENCOUNTER — Other Ambulatory Visit: Payer: Self-pay

## 2019-02-18 DIAGNOSIS — M62830 Muscle spasm of back: Secondary | ICD-10-CM | POA: Diagnosis not present

## 2019-02-18 DIAGNOSIS — M5441 Lumbago with sciatica, right side: Secondary | ICD-10-CM | POA: Diagnosis not present

## 2019-02-18 IMAGING — DX DG CHEST 1V PORT
1 series · 1 of 1 positions shown · non-contrast
Comparison: None.

CLINICAL DATA: Initial evaluation for acute shortness of breath,
cough, history of asthma.

EXAM:
PORTABLE CHEST 1 VIEW

[chest ap]
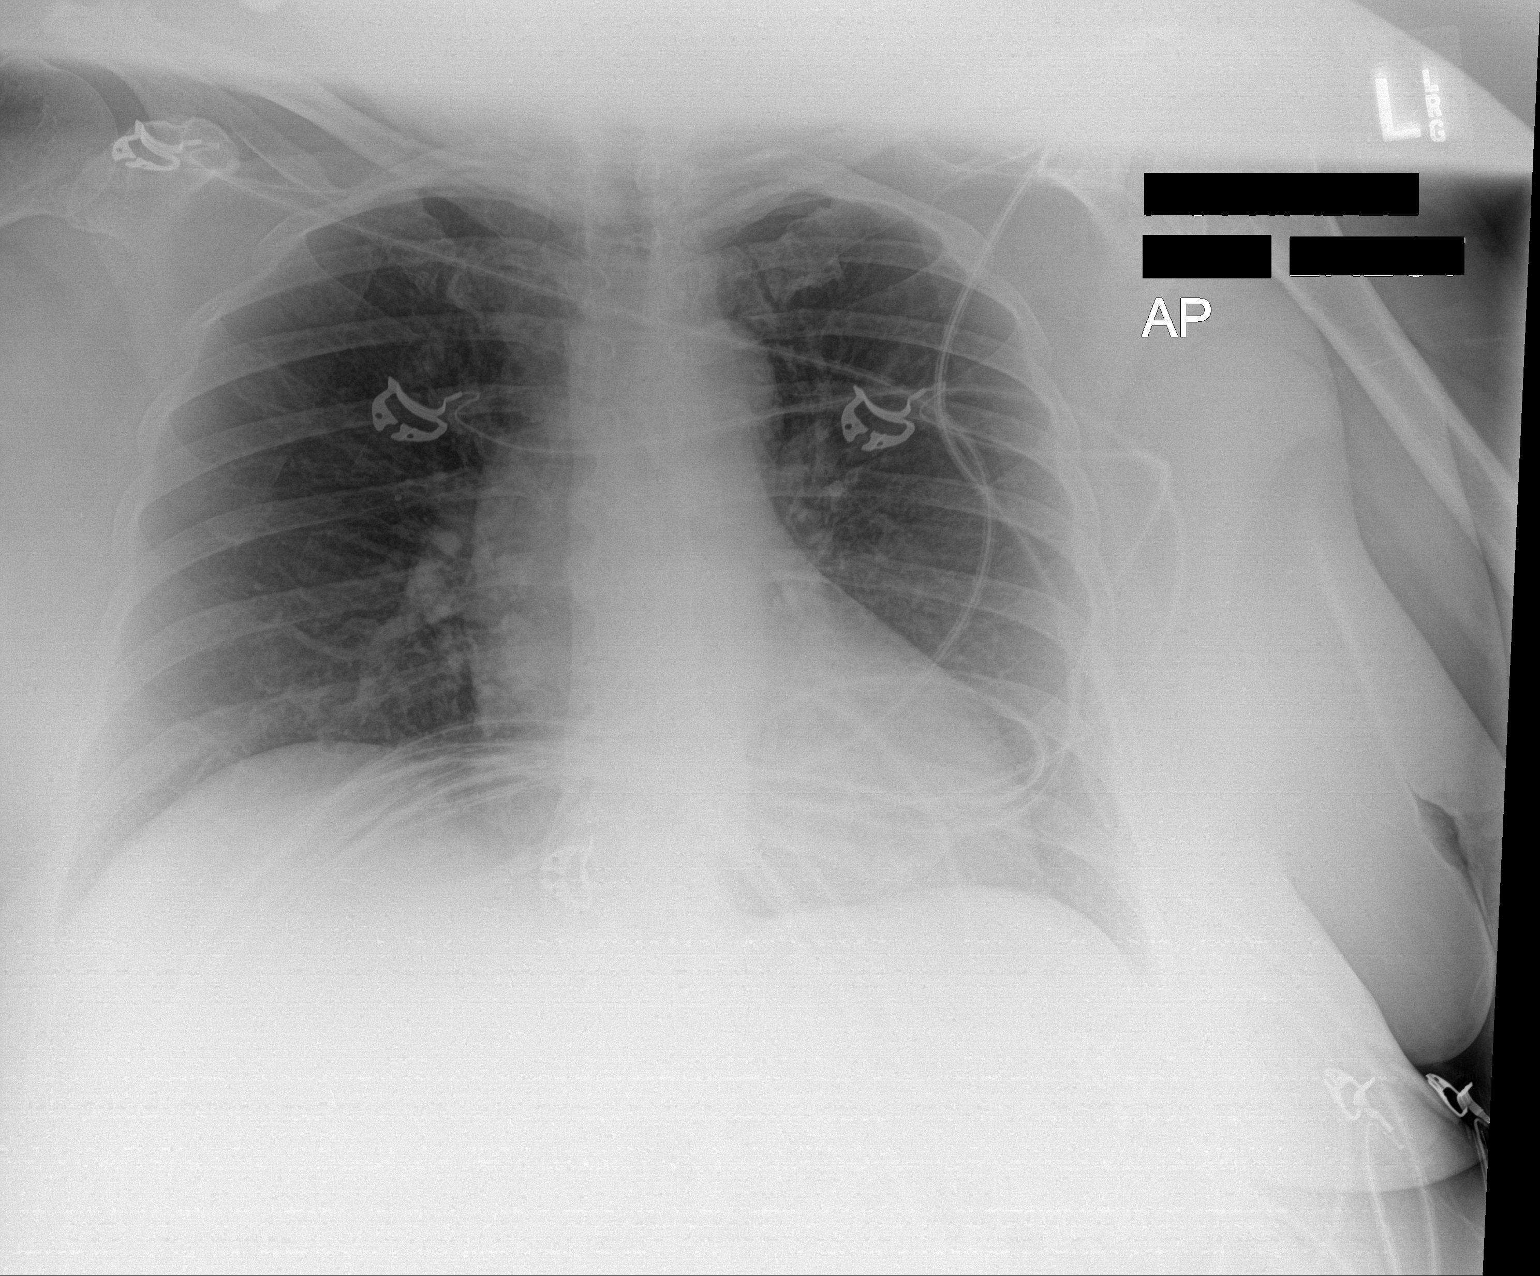

[1 of 1 positions shown; findings below may reference images not displayed]

FINDINGS: The cardiac and mediastinal silhouettes are within normal limits.

The lungs are normally inflated. No significant peribronchial
thickening. No airspace consolidation, pleural effusion, or
pulmonary edema is identified. There is no pneumothorax.

No acute osseous abnormality identified.
IMPRESSION: No radiographic evidence for active cardiopulmonary disease.

## 2019-02-18 NOTE — Therapy (Signed)
[facility] [address] [place], [place], [address] Phone: [phone]   Fax:  [phone]  Physical Therapy Treatment  Patient Details  Name: Kayla Ray MRN: [id number] Date of Birth: 09/23/1950 Referring Provider (PT): Kirby FunkGriffin, John   Encounter Date: 02/18/2019  PT End of Session - 02/18/19 1046    Visit Number  2    Date for PT Re-Evaluation  04/17/19    PT Start Time  1015    PT Stop Time  1100    PT Time Calculation (min)  45 min       Past Medical History:  Diagnosis Date  . Asthma   . Diabetes mellitus without complication [facility](HCC)     Past Surgical History:  Procedure Laterality Date  . ABDOMINAL HYSTERECTOMY     vaginal hysterectomy with ovarian preservation   . CESAREAN SECTION     x2  . GALLBLADDER SURGERY      There were no vitals filed for this visit.  Subjective Assessment - 02/18/19 1023    Subjective  much better with just that 1 stretch    Currently in Pain?  Yes    Pain Score  1     Pain Location  Back    Pain Orientation  Right;Lower                       [facility] Adult PT Treatment/Exercise - 02/18/19 0001      Exercises   Exercises  Lumbar;Knee/Hip      Lumbar Exercises: Seated   Long Arc Quad on Chair  Strengthening;Both;10 reps   red tband   Other Seated Lumbar Exercises  hip flex and abd red tband 10 each    Other Seated Lumbar Exercises  HS curl red tband 10 each      Lumbar Exercises: Supine   Ab Set  10 reps;3 seconds   with ball   Other Supine Lumbar Exercises  bridge, KTC and obl with ball 10 reps      Knee/Hip Exercises: Aerobic   Nustep  L 3 5 min      Modalities   Modalities  Electrical Stimulation;Moist Heat      Moist Heat Therapy   Number Minutes Moist Heat  15 Minutes    Moist Heat Location  Lumbar Spine      Electrical Stimulation   Electrical Stimulation Location  right low back into the buttock    Electrical Stimulation Action   IFC    Electrical Stimulation Parameters  supine    Electrical Stimulation Goals  Pain      Manual Therapy   Manual Therapy  Passive ROM;Soft tissue mobilization    Manual therapy comments  focused stretching on piriformis    Soft tissue mobilization  addressed pirifromis, glutes, and ITB               PT Short Term Goals - 02/15/19 1435      PT SHORT TERM GOAL #1   Title  independent with initial HEP    Time  2    Period  Weeks    Status  New        PT Long Term Goals - 02/15/19 1437      PT LONG TERM GOAL #1   Title  understand postue and body mechanics    Time  8    Period  Weeks    Status  New  PT LONG TERM GOAL #2   Title  increase lumbar ROM to WFL's    Time  8    Period  Weeks    Status  New      PT LONG TERM GOAL #3   Title  decrease pain 50%    Time  8    Period  Weeks    Status  New      PT LONG TERM GOAL #4   Title  independent with an advanced HEP    Time  8    Period  Weeks    Status  New            Plan - 02/18/19 1046    Clinical Impression Statement  pt tolerated initial ther ex progression well with cuing. pt got relief from stretching LE and trunk. tender and pain in RT GT and IT    PT Treatment/Interventions  ADLs/Self Care Home Management;Cryotherapy;Electrical Stimulation;Iontophoresis 4mg /ml Dexamethasone;Moist Heat;Ultrasound;Therapeutic activities;Therapeutic exercise;Neuromuscular re-education;Patient/family education;Manual techniques;Dry needling    PT Next Visit Plan  assess and progress       Patient will benefit from skilled therapeutic intervention in order to improve the following deficits and impairments:  Abnormal gait, Pain, Improper body mechanics, Postural dysfunction, Increased muscle spasms, Decreased mobility, Decreased activity tolerance, Decreased range of motion, Decreased strength, Difficulty walking, Impaired flexibility  Visit Diagnosis: Acute right-sided low back pain with right-sided  sciatica  Muscle spasm of back     Problem List There are no active problems to display for this patient.   Eunice Winecoff,ANGIE PTA 02/18/2019, 10:48 AM  [place], [name], [address] Phone: [phone]   Fax:  [phone]  Name: Kayla Ray MRN: [id number] Date of Birth: [birth year]

## 2019-02-23 ENCOUNTER — Encounter: Payer: Self-pay | Admitting: Physical Therapy

## 2019-02-23 ENCOUNTER — Ambulatory Visit: Payer: Medicare Other | Admitting: Physical Therapy

## 2019-02-23 ENCOUNTER — Other Ambulatory Visit: Payer: Self-pay

## 2019-02-23 DIAGNOSIS — M62830 Muscle spasm of back: Secondary | ICD-10-CM | POA: Diagnosis not present

## 2019-02-23 DIAGNOSIS — M5441 Lumbago with sciatica, right side: Secondary | ICD-10-CM

## 2019-02-23 NOTE — Therapy (Signed)
[place], [name], [address] Phone: [phone]   Fax:  [phone]  Physical Therapy Treatment  Patient Details  Name: Kayla Ray MRN: [id number] Date of Birth: 12/04/1950 Referring Provider (PT): Lavone Orn   Encounter Date: 02/23/2019  PT End of Session - 02/23/19 1223    Visit Number  3    Date for PT Re-Evaluation  04/17/19    PT Start Time  9371    PT Stop Time  1223    PT Time Calculation (min)  38 min    Activity Tolerance  Patient tolerated treatment well    Behavior During Therapy  [facility] for tasks assessed/performed       Past Medical History:  Diagnosis Date  . Asthma   . Diabetes mellitus without complication [facility])     Past Surgical History:  Procedure Laterality Date  . ABDOMINAL HYSTERECTOMY     vaginal hysterectomy with ovarian preservation   . CESAREAN SECTION     x2  . GALLBLADDER SURGERY      There were no vitals filed for this visit.  Subjective Assessment - 02/23/19 1145    Subjective  pain free, sore around hip due to HEP    Currently in Pain?  No/denies    Pain Score  0-No pain                       [facility] Adult PT Treatment/Exercise - 02/23/19 0001      Lumbar Exercises: Aerobic   Nustep  lvl 4 5 min      Lumbar Exercises: Seated   Long Arc Quad on Chair  Strengthening;Both;2 sets;10 reps    LAQ on Chair Weights (lbs)  2    Other Seated Lumbar Exercises  hip flex and abd red tband 10 each    Other Seated Lumbar Exercises  HS curl red tband 10 each      Lumbar Exercises: Supine   Ab Set  10 reps;3 seconds    Other Supine Lumbar Exercises  bridge, KTC and obl with ball 10 reps      Manual Therapy   Manual Therapy  Passive ROM;Soft tissue mobilization    Manual therapy comments  stretched glute max and ITB    Soft tissue mobilization  addressed ITB               PT Short Term Goals - 02/23/19 1226      PT SHORT TERM  GOAL #1   Title  independent with initial HEP    Status  Achieved        PT Long Term Goals - 02/15/19 1437      PT LONG TERM GOAL #1   Title  understand postue and body mechanics    Time  8    Period  Weeks    Status  New      PT LONG TERM GOAL #2   Title  increase lumbar ROM to WFL's    Time  8    Period  Weeks    Status  New      PT LONG TERM GOAL #3   Title  decrease pain 50%    Time  8    Period  Weeks    Status  New      PT LONG TERM GOAL #4   Title  independent with an advanced HEP    Time  8  Period  Weeks    Status  New            Plan - 02/23/19 1224    Clinical Impression Statement  pt. doing well, tension in Rt. glute/ITB but no pain elicited. relief of tension from stretching.    PT Treatment/Interventions  ADLs/Self Care Home Management;Cryotherapy;Electrical Stimulation;Iontophoresis 4mg /ml Dexamethasone;Moist Heat;Ultrasound;Therapeutic activities;Therapeutic exercise;Neuromuscular re-education;Patient/family education;Manual techniques;Dry needling    PT Next Visit Plan  continue to address strength fo the hip and LB while focusing glute/ITB tension       Patient will benefit from skilled therapeutic intervention in order to improve the following deficits and impairments:  Abnormal gait, Pain, Improper body mechanics, Postural dysfunction, Increased muscle spasms, Decreased mobility, Decreased activity tolerance, Decreased range of motion, Decreased strength, Difficulty walking, Impaired flexibility  Visit Diagnosis: Acute right-sided low back pain with right-sided sciatica  Muscle spasm of back     Problem List There are no active problems to display for this patient.   [name], SPTA 02/23/2019, 12:28 PM  [facility] [address] [place], [place], [address] Phone: [phone]   Fax:  [phone]  Name: Kayla Ray MRN: [id number] Date of Birth: 07/02/1950

## 2019-02-25 ENCOUNTER — Encounter: Payer: Self-pay | Admitting: Physical Therapy

## 2019-02-25 ENCOUNTER — Other Ambulatory Visit: Payer: Self-pay

## 2019-02-25 ENCOUNTER — Ambulatory Visit: Payer: Medicare Other | Admitting: Physical Therapy

## 2019-02-25 DIAGNOSIS — M62830 Muscle spasm of back: Secondary | ICD-10-CM | POA: Diagnosis not present

## 2019-02-25 DIAGNOSIS — M5441 Lumbago with sciatica, right side: Secondary | ICD-10-CM | POA: Diagnosis not present

## 2019-02-25 NOTE — Therapy (Signed)
[facility] [address] [place], [place], [address] Phone: [phone]   Fax:  [phone]  Physical Therapy Treatment  Patient Details  Name: Desmond LopeMelony Liddy MRN: [id number] Date of Birth: 12/12/1950 Referring Provider (PT): Kirby FunkGriffin, Zayed Griffie   Encounter Date: 02/25/2019  PT End of Session - 02/25/19 1228    Visit Number  3    Date for PT Re-Evaluation  04/17/19    PT Start Time  1143    PT Stop Time  1226    PT Time Calculation (min)  43 min    Activity Tolerance  Patient tolerated treatment well    Behavior During Therapy  [facility] for tasks assessed/performed       Past Medical History:  Diagnosis Date  . Asthma   . Diabetes mellitus without complication [facility](HCC)     Past Surgical History:  Procedure Laterality Date  . ABDOMINAL HYSTERECTOMY     vaginal hysterectomy with ovarian preservation   . CESAREAN SECTION     x2  . GALLBLADDER SURGERY      There were no vitals filed for this visit.  Subjective Assessment - 02/25/19 1144    Subjective  slightly sore after last sessions exercise. a little sore today    Currently in Pain?  No/denies    Pain Score  0-No pain                       [facility] Adult PT Treatment/Exercise - 02/25/19 0001      Lumbar Exercises: Aerobic   Nustep  lvl 5 6 min      Lumbar Exercises: Machines for Strengthening   Leg Press  20# 2x10      Lumbar Exercises: Seated   Sit to Stand  --   2x8 holding ball   Other Seated Lumbar Exercises  Pelvic control all directions on airex pad 2x10      Lumbar Exercises: Supine   Other Supine Lumbar Exercises  bridge 2x10 3 sec hold 2x10 KTC, Obli 1x10      Knee/Hip Exercises: Standing   Lateral Step Up  2 sets;Right;Step Height: 4"   8 reps   Forward Step Up  Step Height: 4";Right;2 sets   8 reps     Manual Therapy   Manual Therapy  Passive ROM;Soft tissue mobilization    Manual therapy comments  stretched glute max and ITB     Soft tissue mobilization  addressed ITB, tenderness and pain in L shoulder.               PT Short Term Goals - 02/23/19 1226      PT SHORT TERM GOAL #1   Title  independent with initial HEP    Status  Achieved        PT Long Term Goals - 02/25/19 1231      PT LONG TERM GOAL #1   Title  understand postue and body mechanics    Status  On-going      PT LONG TERM GOAL #2   Title  increase lumbar ROM to WFL's    Status  On-going      PT LONG TERM GOAL #3   Title  decrease pain 50%    Status  Achieved      PT LONG TERM GOAL #4   Title  independent with an advanced HEP    Status  On-going  Plan - 02/25/19 1229    Clinical Impression Statement  pt. doing well, tenderness and tension in ITB and Rt glute but no pain in Rt. hip throughout session. toward the end of the session L shoulder began to bother her, no exercises were performed using UEduring session.    PT Treatment/Interventions  ADLs/Self Care Home Management;Cryotherapy;Electrical Stimulation;Iontophoresis 4mg /ml Dexamethasone;Moist Heat;Ultrasound;Therapeutic activities;Therapeutic exercise;Neuromuscular re-education;Patient/family education;Manual techniques;Dry needling    PT Next Visit Plan  continue to address strength fo the hip and LB while focusing glute/ITB tension    PT Home Exercise Plan  continue to stretch       Patient will benefit from skilled therapeutic intervention in order to improve the following deficits and impairments:  Abnormal gait, Pain, Improper body mechanics, Postural dysfunction, Increased muscle spasms, Decreased mobility, Decreased activity tolerance, Decreased range of motion, Decreased strength, Difficulty walking, Impaired flexibility  Visit Diagnosis: Acute right-sided low back pain with right-sided sciatica  Muscle spasm of back     Problem List There are no active problems to display for this patient.   Vickii Chafe 02/25/2019, 12:33  PM  [place], [name], [address] Phone: [phone]   Fax:  [phone]  Name: Aryel Edelen MRN: [id number] Date of Birth: [birth year]

## 2019-03-02 ENCOUNTER — Other Ambulatory Visit: Payer: Self-pay

## 2019-03-02 ENCOUNTER — Ambulatory Visit: Payer: Medicare Other | Attending: Internal Medicine | Admitting: Physical Therapy

## 2019-03-02 ENCOUNTER — Encounter: Payer: Self-pay | Admitting: Physical Therapy

## 2019-03-02 DIAGNOSIS — M5441 Lumbago with sciatica, right side: Secondary | ICD-10-CM | POA: Insufficient documentation

## 2019-03-02 DIAGNOSIS — M62830 Muscle spasm of back: Secondary | ICD-10-CM | POA: Insufficient documentation

## 2019-03-02 NOTE — Therapy (Signed)
[name], [name], [address] Phone: [phone]   Fax:  [phone]  Physical Therapy Treatment  Patient Details  Name: Kayla Ray MRN: [id number] Date of Birth: Mar 03, 1951 Referring Provider (PT): Lavone Orn   Encounter Date: 03/02/2019  PT End of Session - 03/02/19 1227    Visit Number  4    Date for PT Re-Evaluation  04/17/19    PT Start Time  6967    PT Stop Time  1225    PT Time Calculation (min)  40 min    Activity Tolerance  Patient tolerated treatment well    Behavior During Therapy  [facility] for tasks assessed/performed       Past Medical History:  Diagnosis Date  . Asthma   . Diabetes mellitus without complication [facility])     Past Surgical History:  Procedure Laterality Date  . ABDOMINAL HYSTERECTOMY     vaginal hysterectomy with ovarian preservation   . CESAREAN SECTION     x2  . GALLBLADDER SURGERY      There were no vitals filed for this visit.  Subjective Assessment - 03/02/19 1147    Subjective  legs a little shake after exercises. a little soreness deep in right hip    Currently in Pain?  No/denies                       [facility] Adult PT Treatment/Exercise - 03/02/19 0001      Lumbar Exercises: Aerobic   Nustep  lvl 5 6 min      Lumbar Exercises: Standing   Other Standing Lumbar Exercises  BIL Shoulder extension, rows 2x10 R Tband      Lumbar Exercises: Seated   Other Seated Lumbar Exercises  LAQ, Marching, and Hip abduction red Tband 2x10      Lumbar Exercises: Supine   Other Supine Lumbar Exercises  Bridge 2x10 3 sec hold      Knee/Hip Exercises: Standing   Hip Abduction  Both;10 reps;Knee straight;Stengthening;1 set visibly fatigued after    Hip Extension  Stengthening;Both;10 reps;Knee straight;1 set visibly fatigued after     Manual Therapy   Manual Therapy  Passive ROM;Soft tissue mobilization    Manual therapy comments  stretched  glute max and ITB    Soft tissue mobilization  addressed ITB               PT Short Term Goals - 02/23/19 1226      PT SHORT TERM GOAL #1   Title  independent with initial HEP    Status  Achieved        PT Long Term Goals - 02/25/19 1231      PT LONG TERM GOAL #1   Title  understand postue and body mechanics    Status  On-going      PT LONG TERM GOAL #2   Title  increase lumbar ROM to WFL's    Status  On-going      PT LONG TERM GOAL #3   Title  decrease pain 50%    Status  Achieved      PT LONG TERM GOAL #4   Title  independent with an advanced HEP    Status  On-going            Plan - 03/02/19 1228    Clinical Impression Statement  Pt. cotninuing to do well. tenderness in ITB  with some areas of  tension. no reports of pain during exercises, but visible fatigue at times throughout exercises. L shoulder did not bother her this session. HEP was given and demonstarted by myself and patient. PT will be beneficial to continue increasing strength, endurance, and postrual stability    PT Treatment/Interventions  ADLs/Self Care Home Management;Cryotherapy;Electrical Stimulation;Iontophoresis 4mg /ml Dexamethasone;Moist Heat;Ultrasound;Therapeutic activities;Therapeutic exercise;Neuromuscular re-education;Patient/family education;Manual techniques;Dry needling    PT Next Visit Plan  continue to address strength fo the hip and LB while focusing glute/ITB tension    PT Home Exercise Plan  Follow the handouts supplied       Patient will benefit from skilled therapeutic intervention in order to improve the following deficits and impairments:  Abnormal gait, Pain, Improper body mechanics, Postural dysfunction, Increased muscle spasms, Decreased mobility, Decreased activity tolerance, Decreased range of motion, Decreased strength, Difficulty walking, Impaired flexibility  Visit Diagnosis: Acute right-sided low back pain with right-sided sciatica  Muscle spasm of  back     Problem List There are no active problems to display for this patient.   Thomes CakeDylan Nisa Decaire, SPTA 03/02/2019, 12:31 PM  [facility] [address] [place], [place], [address] Phone: [phone]   Fax:  [phone]  Name: Kayla Ray MRN: [id number] Date of Birth: 02/03/1951

## 2019-03-05 ENCOUNTER — Ambulatory Visit: Payer: Medicare Other | Admitting: Physical Therapy

## 2019-03-05 ENCOUNTER — Encounter: Payer: Self-pay | Admitting: Physical Therapy

## 2019-03-05 ENCOUNTER — Other Ambulatory Visit: Payer: Self-pay

## 2019-03-05 DIAGNOSIS — M5441 Lumbago with sciatica, right side: Secondary | ICD-10-CM

## 2019-03-05 DIAGNOSIS — M62830 Muscle spasm of back: Secondary | ICD-10-CM

## 2019-03-05 NOTE — Therapy (Signed)
[facility] [address] [place], [place], [address] Phone: [phone]   Fax:  [phone]  Physical Therapy Treatment  Patient Details  Name: Kayla LopeMelony Ray MRN: [id number] Date of Birth: 09/03/1950 Referring Provider (PT): Kirby FunkGriffin, Adyson Vanburen   Encounter Date: 03/05/2019  PT End of Session - 03/05/19 0843    Visit Number  5    Date for PT Re-Evaluation  04/17/19    PT Start Time  0801    PT Stop Time  0842    PT Time Calculation (min)  41 min    Activity Tolerance  Patient tolerated treatment well    Behavior During Therapy  [facility] for tasks assessed/performed       Past Medical History:  Diagnosis Date  . Asthma   . Diabetes mellitus without complication [facility](HCC)     Past Surgical History:  Procedure Laterality Date  . ABDOMINAL HYSTERECTOMY     vaginal hysterectomy with ovarian preservation   . CESAREAN SECTION     x2  . GALLBLADDER SURGERY      There were no vitals filed for this visit.  Subjective Assessment - 03/05/19 0757    Subjective  tingling down the leg after sitting for a while. better than what it was. not as sore as before in the R hip    Currently in Pain?  No/denies    Pain Score  0-No pain                       [facility] Adult PT Treatment/Exercise - 03/05/19 0001      Self-Care   Self-Care  Lifting;ADL's   proper body mechanics   ADL's  focusing on proper body mechanics, vacuming, sweeping, mopping, laundry in particular   Lifting  focusing on proper mechanics      Lumbar Exercises: Aerobic   Nustep  lvl 5 5 min      Lumbar Exercises: Machines for Strengthening   Cybex Lumbar Extension  Black Tband flx/ext 10 reps     Leg Press  20# 2x10    Other Lumbar Machine Exercise  rows 10 reps      Lumbar Exercises: Standing   Other Standing Lumbar Exercises  forward/side step ups leading with Rt. 2x5     Other Standing Lumbar Exercises  box carry/ box lifts 5#      Manual Therapy    Manual Therapy  Passive ROM;Soft tissue mobilization    Manual therapy comments  stretched glute max ITB and piriformis    Soft tissue mobilization  Piriformis tight               PT Short Term Goals - 02/23/19 1226      PT SHORT TERM GOAL #1   Title  independent with initial HEP    Status  Achieved        PT Long Term Goals - 03/05/19 0805      PT LONG TERM GOAL #1   Title  understand postue and body mechanics    Status  On-going      PT LONG TERM GOAL #2   Title  increase lumbar ROM to WFL's    Status  On-going      PT LONG TERM GOAL #4   Title  independent with an advanced HEP    Status  Achieved            Plan - 03/05/19 0844    Clinical Impression  Statement  Pt. continuing to do well, no pain elicited during exercises. Tension in R glute present. Pirifomis may be causing numbness after prolonged sitting. Demontsrated proper body mechanics and patient able to replicate form. HEP understood and executed as planned. Pt. continues to make progress toward all goals but is limited by muscle strength and functional endurance.    PT Treatment/Interventions  ADLs/Self Care Home Management;Cryotherapy;Electrical Stimulation;Iontophoresis 4mg /ml Dexamethasone;Moist Heat;Ultrasound;Therapeutic activities;Therapeutic exercise;Neuromuscular re-education;Patient/family education;Manual techniques;Dry needling    PT Next Visit Plan  continue to focus on strength, endurance, muscle tension, and body mechanics    PT Home Exercise Plan  Follow the handouts supplied, and use body mechanics taught       Patient will benefit from skilled therapeutic intervention in order to improve the following deficits and impairments:  Abnormal gait, Pain, Improper body mechanics, Postural dysfunction, Increased muscle spasms, Decreased mobility, Decreased activity tolerance, Decreased range of motion, Decreased strength, Difficulty walking, Impaired flexibility  Visit Diagnosis: Acute  right-sided low back pain with right-sided sciatica  Muscle spasm of back     Problem List There are no active problems to display for this patient.   Dylan Narmeen Kerper, SPTA 03/05/2019, 8:48 AM  [place], [name], [address] Phone: [phone]   Fax:  [phone]  Name: Kayla Ray MRN: [id number] Date of Birth: 23-Jun-1951

## 2019-03-09 ENCOUNTER — Ambulatory Visit: Payer: Medicare Other | Admitting: Physical Therapy

## 2019-03-09 ENCOUNTER — Other Ambulatory Visit: Payer: Self-pay

## 2019-03-09 ENCOUNTER — Encounter: Payer: Self-pay | Admitting: Physical Therapy

## 2019-03-09 DIAGNOSIS — M5441 Lumbago with sciatica, right side: Secondary | ICD-10-CM

## 2019-03-09 DIAGNOSIS — M62830 Muscle spasm of back: Secondary | ICD-10-CM | POA: Diagnosis not present

## 2019-03-09 NOTE — Therapy (Signed)
[facility] [address] [place], [place], [address] Phone: [phone]   Fax:  [phone]  Physical Therapy Treatment  Patient Details  Name: Kayla Ray MRN: [id number] Date of Birth: 03/15/1951 Referring Provider (PT): Kirby FunkGriffin, Cordarryl Monrreal   Encounter Date: 03/09/2019  PT End of Session - 03/09/19 1236    Visit Number  6    Date for PT Re-Evaluation  04/17/19    PT Start Time  1151    PT Stop Time  1235    PT Time Calculation (min)  44 min    Activity Tolerance  Patient tolerated treatment well    Behavior During Therapy  [facility] for tasks assessed/performed       Past Medical History:  Diagnosis Date  . Asthma   . Diabetes mellitus without complication [facility](HCC)     Past Surgical History:  Procedure Laterality Date  . ABDOMINAL HYSTERECTOMY     vaginal hysterectomy with ovarian preservation   . CESAREAN SECTION     x2  . GALLBLADDER SURGERY      There were no vitals filed for this visit.  Subjective Assessment - 03/09/19 1152    Subjective  very little tingling after bodywork. R hip not sore, unless bending down but only a twinge    Currently in Pain?  No/denies    Pain Score  0-No pain                       [facility] Adult PT Treatment/Exercise - 03/09/19 0001      Lumbar Exercises: Aerobic   Nustep  lvl 6 5 min      Lumbar Exercises: Machines for Strengthening   Leg Press  30# 2x10      Lumbar Exercises: Standing   Other Standing Lumbar Exercises  AR press/stirs BIL 5 reps; fwd/ side step ups leading with Rt 2x5 6"    Other Standing Lumbar Exercises  box carry/ box lifts 5#      Manual Therapy   Manual Therapy  Passive ROM;Soft tissue mobilization    Manual therapy comments  stretched glute max ITB and piriformis    Soft tissue mobilization  Piriformis tension but STW loosened it up               PT Short Term Goals - 02/23/19 1226      PT SHORT TERM GOAL #1   Title   independent with initial HEP    Status  Achieved        PT Long Term Goals - 03/09/19 1240      PT LONG TERM GOAL #1   Title  understand postue and body mechanics    Status  Achieved      PT LONG TERM GOAL #2   Title  increase lumbar ROM to WFL's    Status  Achieved            Plan - 03/09/19 1237    Clinical Impression Statement  pt progressing well with no pain elicited during treatment. Pt. able to perform exercise very well with cuing for new exercises only. pt. able to demontsrate proper body mechanics without cuing or demonstation. HEP is still being followed and sent pt. home with G Tband for patient progression.    PT Treatment/Interventions  ADLs/Self Care Home Management;Cryotherapy;Electrical Stimulation;Iontophoresis 4mg /ml Dexamethasone;Moist Heat;Ultrasound;Therapeutic activities;Therapeutic exercise;Neuromuscular re-education;Patient/family education;Manual techniques;Dry needling    PT Next Visit Plan  put therapy on hold and assess  condition of patient after two weeks       Patient will benefit from skilled therapeutic intervention in order to improve the following deficits and impairments:  Abnormal gait, Pain, Improper body mechanics, Postural dysfunction, Increased muscle spasms, Decreased mobility, Decreased activity tolerance, Decreased range of motion, Decreased strength, Difficulty walking, Impaired flexibility  Visit Diagnosis: Acute right-sided low back pain with right-sided sciatica  Muscle spasm of back     Problem List There are no active problems to display for this patient.   Vickii Chafe 03/09/2019, 12:41 PM  [place], [name], [address] Phone: [phone]   Fax:  [phone]  Name: Kayla Ray MRN: [id number] Date of Birth: [birth year]

## 2019-03-11 ENCOUNTER — Encounter: Payer: Medicare Other | Admitting: Gynecology

## 2019-03-11 DIAGNOSIS — H6122 Impacted cerumen, left ear: Secondary | ICD-10-CM | POA: Diagnosis not present

## 2019-03-11 DIAGNOSIS — Z23 Encounter for immunization: Secondary | ICD-10-CM | POA: Diagnosis not present

## 2019-03-11 DIAGNOSIS — H9202 Otalgia, left ear: Secondary | ICD-10-CM | POA: Diagnosis not present

## 2019-03-12 ENCOUNTER — Ambulatory Visit: Payer: Medicare Other | Admitting: Physical Therapy

## 2019-03-17 DIAGNOSIS — M26622 Arthralgia of left temporomandibular joint: Secondary | ICD-10-CM | POA: Diagnosis not present

## 2019-03-17 DIAGNOSIS — H9202 Otalgia, left ear: Secondary | ICD-10-CM | POA: Diagnosis not present

## 2019-03-30 ENCOUNTER — Other Ambulatory Visit: Payer: Self-pay

## 2019-03-31 ENCOUNTER — Ambulatory Visit (INDEPENDENT_AMBULATORY_CARE_PROVIDER_SITE_OTHER): Payer: Medicare Other | Admitting: Gynecology

## 2019-03-31 ENCOUNTER — Encounter: Payer: Self-pay | Admitting: Gynecology

## 2019-03-31 VITALS — BP 130/80 | Ht 59.0 in | Wt 239.0 lb

## 2019-03-31 DIAGNOSIS — N952 Postmenopausal atrophic vaginitis: Secondary | ICD-10-CM

## 2019-03-31 DIAGNOSIS — Z01419 Encounter for gynecological examination (general) (routine) without abnormal findings: Secondary | ICD-10-CM | POA: Diagnosis not present

## 2019-03-31 NOTE — Patient Instructions (Signed)
Follow-up in 1 year for annual exam, sooner if any issues. 

## 2019-03-31 NOTE — Progress Notes (Signed)
    Kayla Ray May 06, 1951 [id number]        68 y.o.  G2P2 for breast and pelvic exam.  Without gynecologic complaints  Past medical history,surgical history, problem list, medications, allergies, family history and social history were all reviewed and documented as reviewed in the EPIC chart.  ROS:  Performed with pertinent positives and negatives included in the history, assessment and plan.   Additional significant findings : None   Exam: Kayla Ray assistant Vitals:   03/31/19 1510  BP: 130/80  Weight: 239 lb (108.4 kg)  Height: 4\' 11"  (1.499 m)   Body mass index is 48.27 kg/m.  General appearance:  Normal affect, orientation and appearance. Skin: Grossly normal HEENT: Without gross lesions.  No cervical or supraclavicular adenopathy. Thyroid normal.  Lungs:  Clear without wheezing, rales or rhonchi Cardiac: RR, without RMG Abdominal:  Soft, nontender, without masses, guarding, rebound, organomegaly or hernia Breasts:  Examined lying and sitting without masses, retractions, discharge or axillary adenopathy. Pelvic:  Ext, BUS, Vagina: With atrophic changes  Adnexa: Without masses or tenderness    Anus and perineum: Normal   Rectovaginal: Normal sphincter tone without palpated masses or tenderness.    Assessment/Plan:  68 y.o. G2P2 female for breast and pelvic exam.  1. Postmenopausal.  Status post hysterectomy in the past for benign indications.  No significant menopausal symptoms. 2. Pap smear 2019.  No Pap smear done today.  No history of abnormal Pap smears. 3. Mammography coming due in December and I reminded her to schedule this.  Breast exam normal today. 4. Colonoscopy 2017.  Repeat at their recommended interval. 5. DEXA 2019 normal.  Recommend repeat at 5-year interval. 6. Health maintenance.  No routine lab work done as patient does this elsewhere.  Follow-up 1 year, sooner as needed.   [name] MD, 3:41 PM 03/31/2019

## 2019-04-07 ENCOUNTER — Encounter: Payer: Self-pay | Admitting: Gynecology

## 2019-04-07 DIAGNOSIS — G4700 Insomnia, unspecified: Secondary | ICD-10-CM | POA: Diagnosis not present

## 2019-04-07 DIAGNOSIS — K635 Polyp of colon: Secondary | ICD-10-CM | POA: Diagnosis not present

## 2019-04-07 DIAGNOSIS — E7800 Pure hypercholesterolemia, unspecified: Secondary | ICD-10-CM | POA: Diagnosis not present

## 2019-04-07 DIAGNOSIS — J45909 Unspecified asthma, uncomplicated: Secondary | ICD-10-CM | POA: Diagnosis not present

## 2019-04-07 DIAGNOSIS — E039 Hypothyroidism, unspecified: Secondary | ICD-10-CM | POA: Diagnosis not present

## 2019-04-07 DIAGNOSIS — F324 Major depressive disorder, single episode, in partial remission: Secondary | ICD-10-CM | POA: Diagnosis not present

## 2019-04-07 DIAGNOSIS — E1169 Type 2 diabetes mellitus with other specified complication: Secondary | ICD-10-CM | POA: Diagnosis not present

## 2019-04-07 DIAGNOSIS — I700 Atherosclerosis of aorta: Secondary | ICD-10-CM | POA: Diagnosis not present

## 2019-04-07 DIAGNOSIS — Z23 Encounter for immunization: Secondary | ICD-10-CM | POA: Diagnosis not present

## 2019-04-07 DIAGNOSIS — G4733 Obstructive sleep apnea (adult) (pediatric): Secondary | ICD-10-CM | POA: Diagnosis not present

## 2019-04-26 DIAGNOSIS — H0102A Squamous blepharitis right eye, upper and lower eyelids: Secondary | ICD-10-CM | POA: Diagnosis not present

## 2019-04-26 DIAGNOSIS — H401131 Primary open-angle glaucoma, bilateral, mild stage: Secondary | ICD-10-CM | POA: Diagnosis not present

## 2019-04-26 DIAGNOSIS — Z961 Presence of intraocular lens: Secondary | ICD-10-CM | POA: Diagnosis not present

## 2019-04-26 DIAGNOSIS — H0102B Squamous blepharitis left eye, upper and lower eyelids: Secondary | ICD-10-CM | POA: Diagnosis not present

## 2019-04-26 DIAGNOSIS — H10413 Chronic giant papillary conjunctivitis, bilateral: Secondary | ICD-10-CM | POA: Diagnosis not present

## 2019-04-26 DIAGNOSIS — E119 Type 2 diabetes mellitus without complications: Secondary | ICD-10-CM | POA: Diagnosis not present

## 2019-05-12 DIAGNOSIS — K635 Polyp of colon: Secondary | ICD-10-CM | POA: Diagnosis not present

## 2019-05-19 ENCOUNTER — Other Ambulatory Visit: Payer: Self-pay | Admitting: Gynecology

## 2019-05-19 DIAGNOSIS — Z1231 Encounter for screening mammogram for malignant neoplasm of breast: Secondary | ICD-10-CM

## 2019-05-31 ENCOUNTER — Other Ambulatory Visit: Payer: Self-pay

## 2019-05-31 DIAGNOSIS — J45901 Unspecified asthma with (acute) exacerbation: Secondary | ICD-10-CM | POA: Diagnosis not present

## 2019-05-31 DIAGNOSIS — Z20828 Contact with and (suspected) exposure to other viral communicable diseases: Secondary | ICD-10-CM | POA: Diagnosis not present

## 2019-05-31 DIAGNOSIS — Z20822 Contact with and (suspected) exposure to covid-19: Secondary | ICD-10-CM

## 2019-06-01 LAB — NOVEL CORONAVIRUS, NAA: SARS-CoV-2, NAA: NOT DETECTED

## 2019-06-11 DIAGNOSIS — Z1159 Encounter for screening for other viral diseases: Secondary | ICD-10-CM | POA: Diagnosis not present

## 2019-07-14 ENCOUNTER — Ambulatory Visit
Admission: RE | Admit: 2019-07-14 | Discharge: 2019-07-14 | Disposition: A | Discharge status: Home / Self Care | Payer: Medicare Other | Source: Ambulatory Visit | Attending: Gynecology | Admitting: Gynecology

## 2019-07-14 ENCOUNTER — Other Ambulatory Visit: Payer: Self-pay

## 2019-07-14 DIAGNOSIS — Z1231 Encounter for screening mammogram for malignant neoplasm of breast: Secondary | ICD-10-CM | POA: Diagnosis not present

## 2019-08-24 DIAGNOSIS — H0102A Squamous blepharitis right eye, upper and lower eyelids: Secondary | ICD-10-CM | POA: Diagnosis not present

## 2019-08-24 DIAGNOSIS — H10413 Chronic giant papillary conjunctivitis, bilateral: Secondary | ICD-10-CM | POA: Diagnosis not present

## 2019-08-24 DIAGNOSIS — H401131 Primary open-angle glaucoma, bilateral, mild stage: Secondary | ICD-10-CM | POA: Diagnosis not present

## 2019-08-24 DIAGNOSIS — Z961 Presence of intraocular lens: Secondary | ICD-10-CM | POA: Diagnosis not present

## 2019-08-24 DIAGNOSIS — H0102B Squamous blepharitis left eye, upper and lower eyelids: Secondary | ICD-10-CM | POA: Diagnosis not present

## 2019-09-02 IMAGING — MG MM BREAST LOCALIZATION CLIP
2 series · 2 of 2 positions shown · non-contrast
Comparison: Previous exam(s).

CLINICAL DATA: Patient is post stereotactic core needle biopsy of a
group of indeterminate microcalcifications over the right upper
outer quadrant.

EXAM:
DIAGNOSTIC RIGHT MAMMOGRAM POST STEREOTACTIC BIOPSY

[R ML]
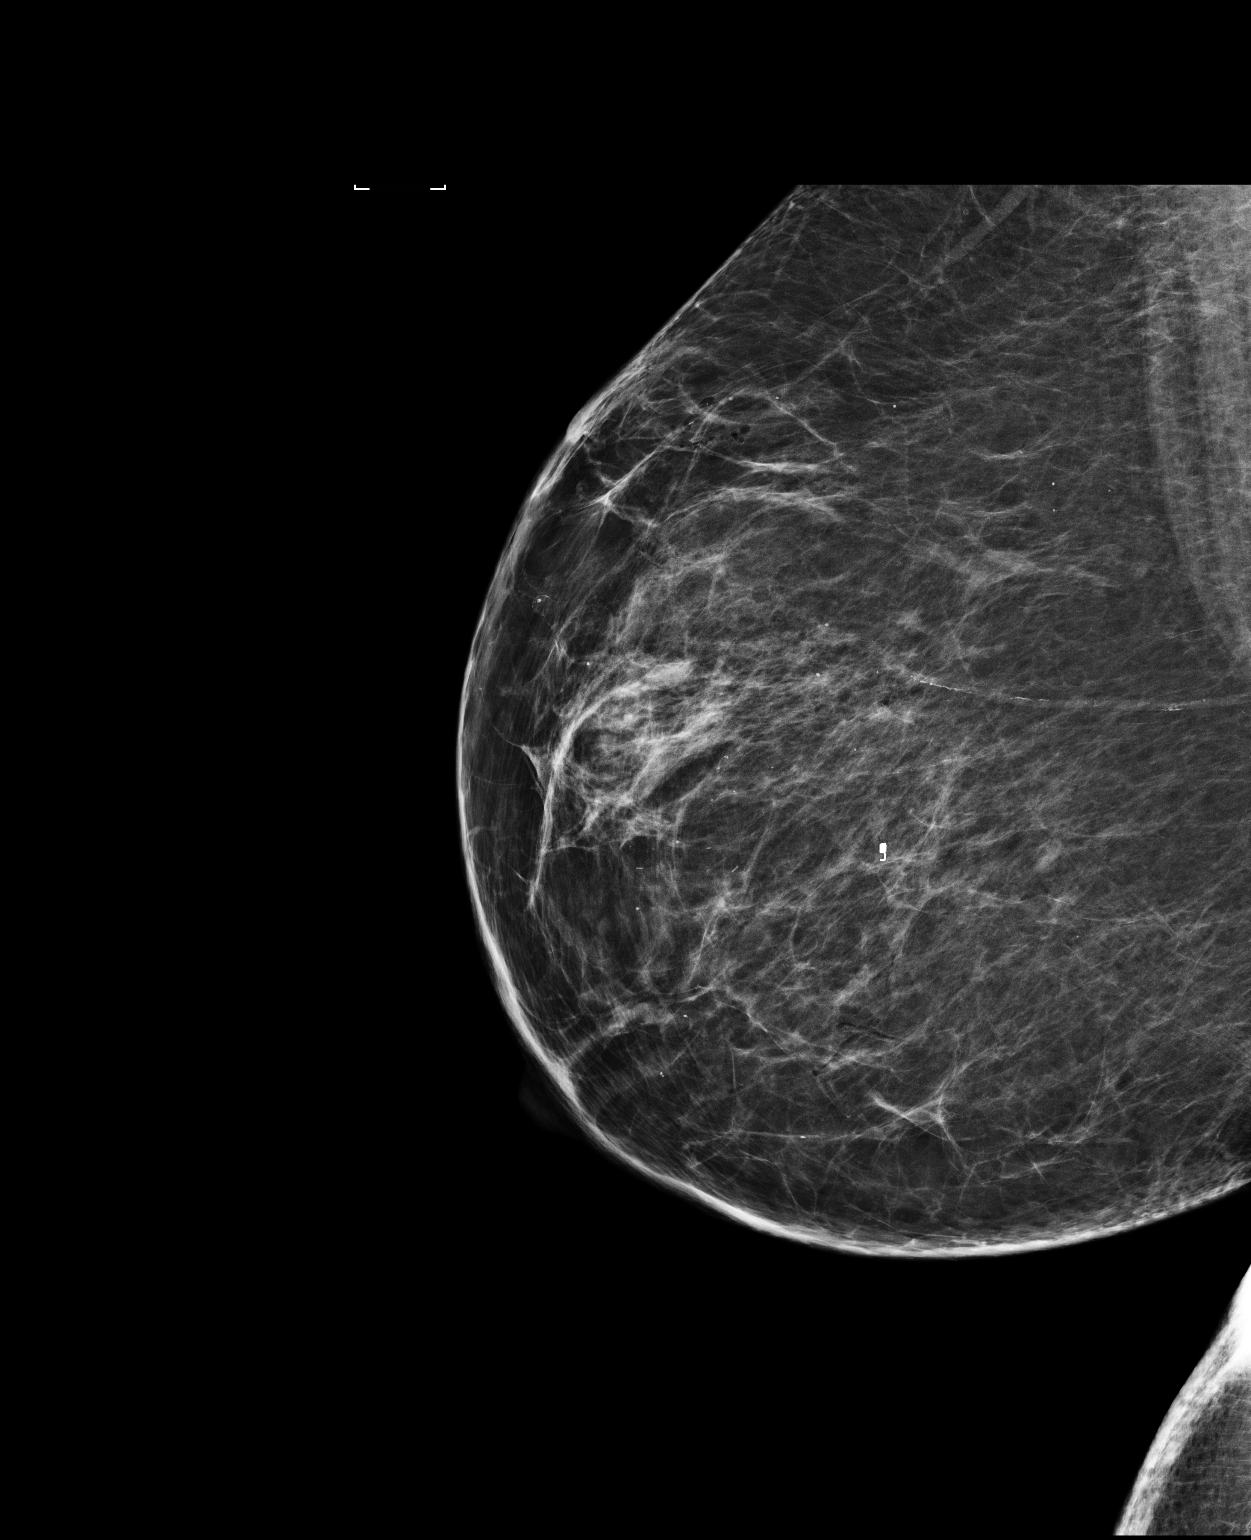

[R CC]
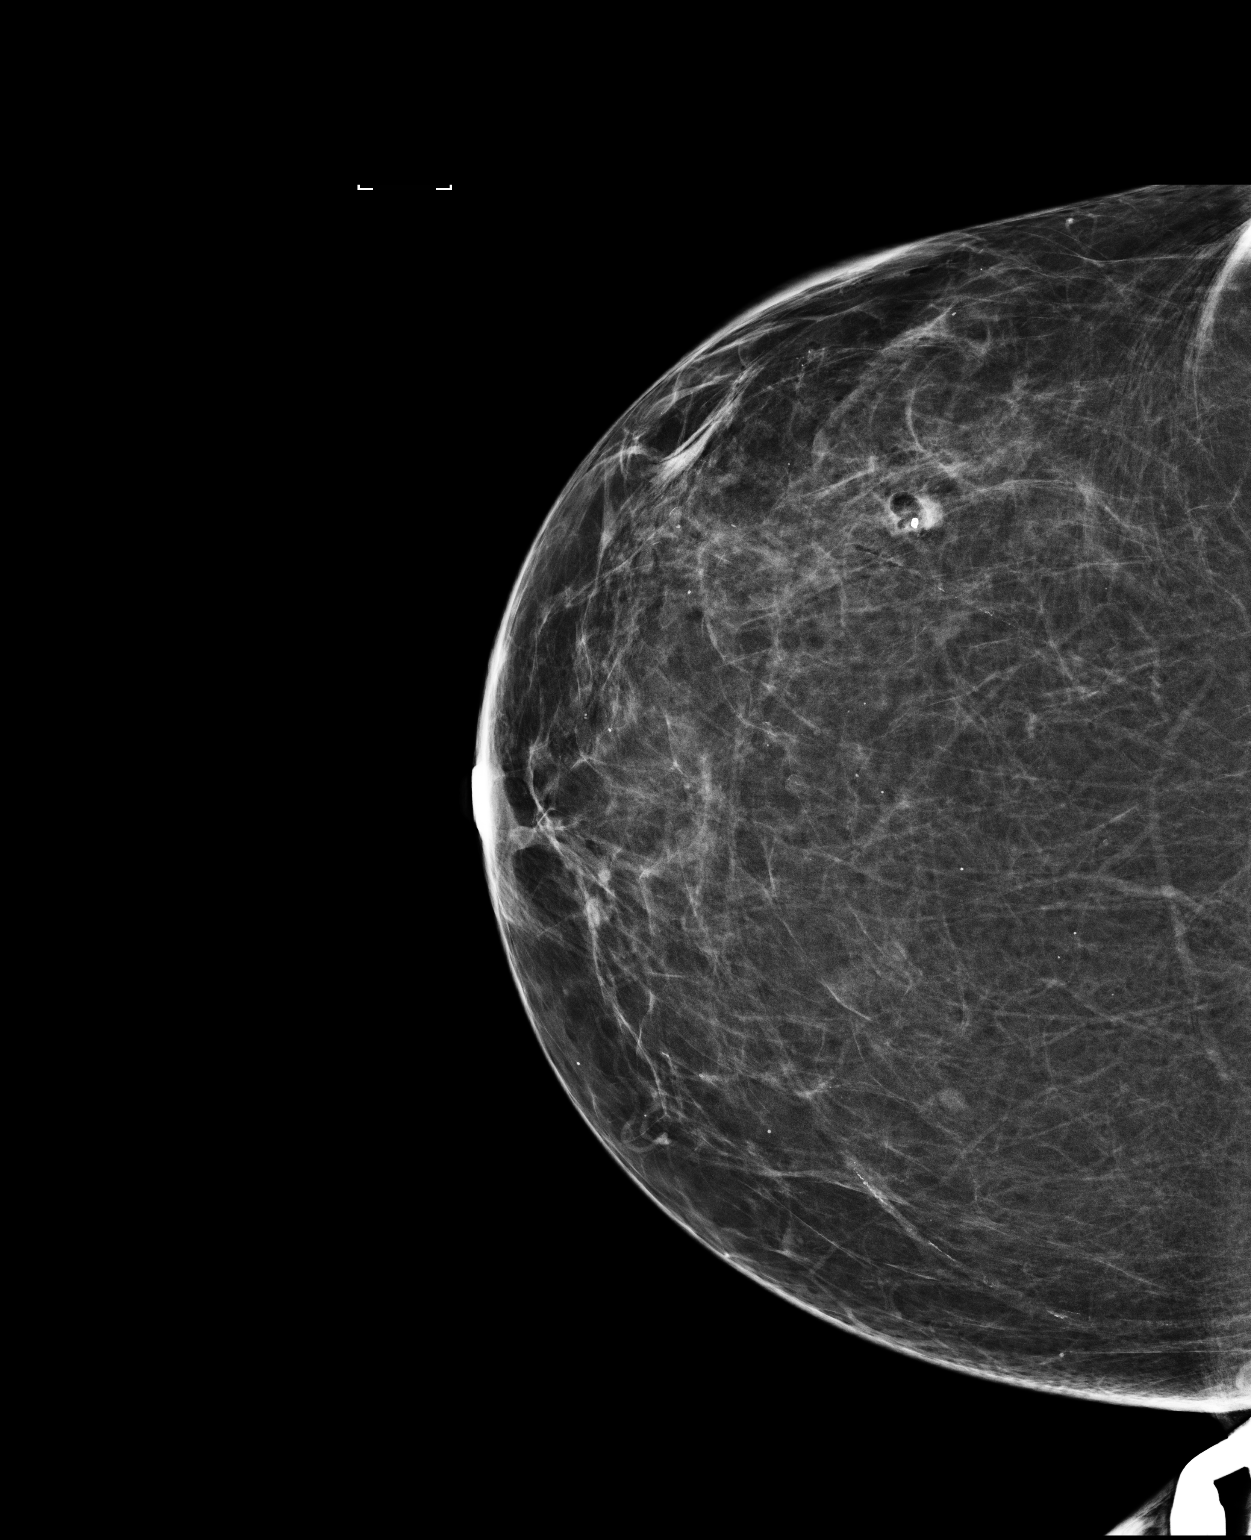

[2 of 2 positions shown; findings below may reference images not displayed]

FINDINGS: Mammographic images were obtained following stereotactic guided
biopsy of right breast microcalcifications. Images demonstrates
satisfactory placement of a coil shaped metallic clip over the
biopsied microcalcifications on the CC image as the clip is
approximately 2.5 cm inferior to the biopsy site on the ML image.
IMPRESSION: Clip placement as described post stereotactic core needle biopsy
right breast microcalcifications.

Final Assessment: Post Procedure Mammograms for Marker Placement

## 2019-09-21 DIAGNOSIS — F419 Anxiety disorder, unspecified: Secondary | ICD-10-CM | POA: Diagnosis not present

## 2019-09-29 DIAGNOSIS — F419 Anxiety disorder, unspecified: Secondary | ICD-10-CM | POA: Diagnosis not present

## 2019-10-05 DIAGNOSIS — G4733 Obstructive sleep apnea (adult) (pediatric): Secondary | ICD-10-CM | POA: Diagnosis not present

## 2019-10-06 DIAGNOSIS — F419 Anxiety disorder, unspecified: Secondary | ICD-10-CM | POA: Diagnosis not present

## 2019-10-13 DIAGNOSIS — F419 Anxiety disorder, unspecified: Secondary | ICD-10-CM | POA: Diagnosis not present

## 2019-10-28 DIAGNOSIS — F419 Anxiety disorder, unspecified: Secondary | ICD-10-CM | POA: Diagnosis not present

## 2019-11-02 DIAGNOSIS — F419 Anxiety disorder, unspecified: Secondary | ICD-10-CM | POA: Diagnosis not present

## 2019-11-04 ENCOUNTER — Other Ambulatory Visit (HOSPITAL_COMMUNITY)
Admission: RE | Admit: 2019-11-04 | Discharge: 2019-11-04 | Disposition: A | Discharge status: Home / Self Care | Payer: Medicare Other | Source: Other Acute Inpatient Hospital | Attending: Women's Health | Admitting: Women's Health

## 2019-11-04 DIAGNOSIS — N951 Menopausal and female climacteric states: Secondary | ICD-10-CM | POA: Insufficient documentation

## 2019-11-05 LAB — ESTRADIOL: Estradiol: 12.1 pg/mL

## 2019-11-05 LAB — TESTOSTERONE: Testosterone: 8 ng/dL (ref 3–41)

## 2019-11-05 LAB — PROGESTERONE: Progesterone: 2 ng/mL

## 2019-11-09 DIAGNOSIS — F419 Anxiety disorder, unspecified: Secondary | ICD-10-CM | POA: Diagnosis not present

## 2019-11-16 DIAGNOSIS — F419 Anxiety disorder, unspecified: Secondary | ICD-10-CM | POA: Diagnosis not present

## 2019-11-23 DIAGNOSIS — E782 Mixed hyperlipidemia: Secondary | ICD-10-CM | POA: Diagnosis not present

## 2019-11-23 DIAGNOSIS — E1169 Type 2 diabetes mellitus with other specified complication: Secondary | ICD-10-CM | POA: Diagnosis not present

## 2019-11-23 DIAGNOSIS — G4700 Insomnia, unspecified: Secondary | ICD-10-CM | POA: Diagnosis not present

## 2019-11-23 DIAGNOSIS — Z23 Encounter for immunization: Secondary | ICD-10-CM | POA: Diagnosis not present

## 2019-11-23 DIAGNOSIS — F325 Major depressive disorder, single episode, in full remission: Secondary | ICD-10-CM | POA: Diagnosis not present

## 2019-11-23 DIAGNOSIS — J45909 Unspecified asthma, uncomplicated: Secondary | ICD-10-CM | POA: Diagnosis not present

## 2019-11-23 DIAGNOSIS — G4733 Obstructive sleep apnea (adult) (pediatric): Secondary | ICD-10-CM | POA: Diagnosis not present

## 2019-11-23 DIAGNOSIS — Z6841 Body Mass Index (BMI) 40.0 and over, adult: Secondary | ICD-10-CM | POA: Diagnosis not present

## 2019-11-23 DIAGNOSIS — I700 Atherosclerosis of aorta: Secondary | ICD-10-CM | POA: Diagnosis not present

## 2019-11-23 DIAGNOSIS — E7800 Pure hypercholesterolemia, unspecified: Secondary | ICD-10-CM | POA: Diagnosis not present

## 2019-11-23 DIAGNOSIS — E039 Hypothyroidism, unspecified: Secondary | ICD-10-CM | POA: Diagnosis not present

## 2019-11-23 DIAGNOSIS — Z0000 Encounter for general adult medical examination without abnormal findings: Secondary | ICD-10-CM | POA: Diagnosis not present

## 2019-12-15 DIAGNOSIS — E119 Type 2 diabetes mellitus without complications: Secondary | ICD-10-CM | POA: Diagnosis not present

## 2019-12-15 DIAGNOSIS — Z961 Presence of intraocular lens: Secondary | ICD-10-CM | POA: Diagnosis not present

## 2019-12-15 DIAGNOSIS — H0102B Squamous blepharitis left eye, upper and lower eyelids: Secondary | ICD-10-CM | POA: Diagnosis not present

## 2019-12-15 DIAGNOSIS — H10413 Chronic giant papillary conjunctivitis, bilateral: Secondary | ICD-10-CM | POA: Diagnosis not present

## 2019-12-15 DIAGNOSIS — H401131 Primary open-angle glaucoma, bilateral, mild stage: Secondary | ICD-10-CM | POA: Diagnosis not present

## 2019-12-15 DIAGNOSIS — H0102A Squamous blepharitis right eye, upper and lower eyelids: Secondary | ICD-10-CM | POA: Diagnosis not present

## 2019-12-28 DIAGNOSIS — F419 Anxiety disorder, unspecified: Secondary | ICD-10-CM | POA: Diagnosis not present

## 2020-01-13 DIAGNOSIS — H401131 Primary open-angle glaucoma, bilateral, mild stage: Secondary | ICD-10-CM | POA: Diagnosis not present

## 2020-03-09 DIAGNOSIS — H401131 Primary open-angle glaucoma, bilateral, mild stage: Secondary | ICD-10-CM | POA: Diagnosis not present

## 2020-05-17 DIAGNOSIS — I700 Atherosclerosis of aorta: Secondary | ICD-10-CM | POA: Diagnosis not present

## 2020-05-17 DIAGNOSIS — E039 Hypothyroidism, unspecified: Secondary | ICD-10-CM | POA: Diagnosis not present

## 2020-05-17 DIAGNOSIS — F331 Major depressive disorder, recurrent, moderate: Secondary | ICD-10-CM | POA: Diagnosis not present

## 2020-05-17 DIAGNOSIS — J4531 Mild persistent asthma with (acute) exacerbation: Secondary | ICD-10-CM | POA: Diagnosis not present

## 2020-05-17 DIAGNOSIS — E782 Mixed hyperlipidemia: Secondary | ICD-10-CM | POA: Diagnosis not present

## 2020-05-17 DIAGNOSIS — E1169 Type 2 diabetes mellitus with other specified complication: Secondary | ICD-10-CM | POA: Diagnosis not present

## 2020-05-17 DIAGNOSIS — G4733 Obstructive sleep apnea (adult) (pediatric): Secondary | ICD-10-CM | POA: Diagnosis not present

## 2020-05-23 DIAGNOSIS — K118 Other diseases of salivary glands: Secondary | ICD-10-CM | POA: Diagnosis not present

## 2020-05-30 DIAGNOSIS — K1120 Sialoadenitis, unspecified: Secondary | ICD-10-CM | POA: Diagnosis not present

## 2020-06-07 ENCOUNTER — Other Ambulatory Visit: Payer: Self-pay | Admitting: Internal Medicine

## 2020-06-07 DIAGNOSIS — Z1231 Encounter for screening mammogram for malignant neoplasm of breast: Secondary | ICD-10-CM

## 2020-06-23 DIAGNOSIS — Z20822 Contact with and (suspected) exposure to covid-19: Secondary | ICD-10-CM | POA: Diagnosis not present

## 2020-06-23 DIAGNOSIS — Z03818 Encounter for observation for suspected exposure to other biological agents ruled out: Secondary | ICD-10-CM | POA: Diagnosis not present

## 2020-07-13 DIAGNOSIS — H401131 Primary open-angle glaucoma, bilateral, mild stage: Secondary | ICD-10-CM | POA: Diagnosis not present

## 2020-07-20 ENCOUNTER — Ambulatory Visit: Payer: Medicare Other

## 2020-07-25 ENCOUNTER — Ambulatory Visit
Admission: RE | Admit: 2020-07-25 | Discharge: 2020-07-25 | Disposition: A | Discharge status: Home / Self Care | Payer: Medicare Other | Source: Ambulatory Visit | Attending: Internal Medicine | Admitting: Internal Medicine

## 2020-07-25 ENCOUNTER — Other Ambulatory Visit: Payer: Self-pay

## 2020-07-25 DIAGNOSIS — Z1231 Encounter for screening mammogram for malignant neoplasm of breast: Secondary | ICD-10-CM | POA: Diagnosis not present

## 2020-07-26 ENCOUNTER — Encounter: Payer: Self-pay | Admitting: Obstetrics and Gynecology

## 2020-07-26 ENCOUNTER — Ambulatory Visit (INDEPENDENT_AMBULATORY_CARE_PROVIDER_SITE_OTHER): Payer: Medicare Other | Admitting: Obstetrics and Gynecology

## 2020-07-26 VITALS — BP 130/80 | Ht 59.0 in | Wt 237.0 lb

## 2020-07-26 DIAGNOSIS — Z01419 Encounter for gynecological examination (general) (routine) without abnormal findings: Secondary | ICD-10-CM | POA: Diagnosis not present

## 2020-07-26 NOTE — Progress Notes (Signed)
Judianne Ray Jul 11, 1950 [id number]  SUBJECTIVE:  70 y.o. G2P2 female here for a breast and pelvic exam. She has no gynecologic concerns.  Current Outpatient Medications  Medication Sig Dispense Refill  . albuterol (PROVENTIL HFA;VENTOLIN HFA) 108 (90 Base) MCG/ACT inhaler Inhale 1-2 puffs into the lungs every 6 (six) hours as needed for wheezing or shortness of breath.    Marland Kitchen albuterol (PROVENTIL) (2.5 MG/3ML) 0.083% nebulizer solution Take 2.5 mg by nebulization every 6 (six) hours as needed for wheezing or shortness of breath.    Marland Kitchen albuterol (PROVENTIL) (2.5 MG/3ML) 0.083% nebulizer solution Take 3 mLs (2.5 mg total) by nebulization every 6 (six) hours as needed for wheezing or shortness of breath. 75 mL 0  . atorvastatin (LIPITOR) 10 MG tablet Take 10 mg by mouth daily.  3  . bimatoprost (LUMIGAN) 0.01 % SOLN 1 drop at bedtime.    . brimonidine-timolol (COMBIGAN) 0.2-0.5 % ophthalmic solution Place 1 drop into both eyes every 12 (twelve) hours.    . Dulaglutide 0.75 MG/0.5ML SOPN Inject into the skin.    Marland Kitchen levothyroxine (SYNTHROID, LEVOTHROID) 50 MCG tablet Take 50 mcg by mouth daily before breakfast.    . MELATONIN PO Take 1 tablet by mouth at bedtime as needed (sleep).    . montelukast (SINGULAIR) 10 MG tablet Take 10 mg by mouth at bedtime.    . multivitamin-lutein (OCUVITE-LUTEIN) CAPS capsule Take 1 capsule by mouth daily.    Marland Kitchen PRESCRIPTION MEDICATION 1 each daily.    . traZODone (DESYREL) 150 MG tablet Take 150 mg by mouth at bedtime.    . Vilazodone HCl 20 MG TABS Take 60 mg by mouth daily.     No current facility-administered medications for this visit.   Allergies: Jardiance [empagliflozin], Metformin and related, Bactrim [sulfamethoxazole-trimethoprim], and Glimepiride  No LMP recorded. Patient is postmenopausal.  Past medical history,surgical history, problem list, medications, allergies, family history and social history were all reviewed and documented as reviewed in the  EPIC chart.  GYN ROS: no abnormal bleeding, pelvic pain or discharge, no breast pain or new or enlarging lumps on self exam.  No dysuria, frequency, burning, pain with urination, cloudy/malodorous urine.   OBJECTIVE:  BP 130/80 (BP Location: Right Arm, Patient Position: Sitting, Cuff Size: Large)   Ht 4\' 11"  (1.499 m)   Wt 237 lb (107.5 kg)   BMI 47.87 kg/m  The patient appears well, alert, oriented, in no distress.  BREAST EXAM: breasts appear normal, no suspicious masses, no skin or nipple changes or axillary nodes  PELVIC EXAM: VULVA: normal appearing vulva with atrophic change, no masses, tenderness or lesions, VAGINA: normal appearing vagina with atrophic change, normal color and discharge, no lesions, CERVIX: surgically absent, UTERUS: surgically absent, vaginal cuff normal, ADNEXA: Unable to palpate but nontender Chaperone: Bonham present during the examination  ASSESSMENT:  70 y.o. G2P2 here for a breast and pelvic exam  PLAN:   1. Postmenopausal.  Prior hysterectomy for benign indications.  No significant menopausal symptoms. 2. Pap smear 03/2018.  No history of abnormal Pap smears.  We discussed screening guidelines today and option to discontinue based on age and prior hysterectomy.  Plan to revisit at next 3 year interval if she desires to continue screening. 3. Mammogram done yesterday.  Normal breast exam today.  Continue with annual mammography.   4. Colonoscopy 2017.  She will follow up at the interval recommended by her GI specialist.   5. DEXA 2019.  Normal.  Next DEXA  recommended 2024. 6. Health maintenance.  No labs today as she normally has these completed elsewhere. The patient is aware that I will only be at this practice until early March 2022 so she knows to make sure she requests follow-up on any results if any testing is completed when I am no longer at the practice.  Return annually or sooner, prn.  Theresia Majors MD 07/26/20

## 2020-08-11 DIAGNOSIS — F3341 Major depressive disorder, recurrent, in partial remission: Secondary | ICD-10-CM | POA: Diagnosis not present

## 2020-08-25 DIAGNOSIS — U071 COVID-19: Secondary | ICD-10-CM | POA: Diagnosis not present

## 2020-08-25 DIAGNOSIS — J208 Acute bronchitis due to other specified organisms: Secondary | ICD-10-CM | POA: Diagnosis not present

## 2020-08-25 DIAGNOSIS — J45909 Unspecified asthma, uncomplicated: Secondary | ICD-10-CM | POA: Diagnosis not present

## 2020-08-25 DIAGNOSIS — J40 Bronchitis, not specified as acute or chronic: Secondary | ICD-10-CM | POA: Diagnosis not present

## 2020-08-31 DIAGNOSIS — F3341 Major depressive disorder, recurrent, in partial remission: Secondary | ICD-10-CM | POA: Diagnosis not present

## 2020-09-25 DIAGNOSIS — J45909 Unspecified asthma, uncomplicated: Secondary | ICD-10-CM | POA: Diagnosis not present

## 2020-09-25 DIAGNOSIS — R059 Cough, unspecified: Secondary | ICD-10-CM | POA: Diagnosis not present

## 2020-10-18 DIAGNOSIS — G4733 Obstructive sleep apnea (adult) (pediatric): Secondary | ICD-10-CM | POA: Diagnosis not present

## 2020-11-08 DIAGNOSIS — E119 Type 2 diabetes mellitus without complications: Secondary | ICD-10-CM | POA: Diagnosis not present

## 2020-11-08 DIAGNOSIS — H401131 Primary open-angle glaucoma, bilateral, mild stage: Secondary | ICD-10-CM | POA: Diagnosis not present

## 2020-11-08 DIAGNOSIS — H0102B Squamous blepharitis left eye, upper and lower eyelids: Secondary | ICD-10-CM | POA: Diagnosis not present

## 2020-11-08 DIAGNOSIS — H10413 Chronic giant papillary conjunctivitis, bilateral: Secondary | ICD-10-CM | POA: Diagnosis not present

## 2020-11-08 DIAGNOSIS — Z961 Presence of intraocular lens: Secondary | ICD-10-CM | POA: Diagnosis not present

## 2020-11-08 DIAGNOSIS — H0102A Squamous blepharitis right eye, upper and lower eyelids: Secondary | ICD-10-CM | POA: Diagnosis not present

## 2021-01-17 DIAGNOSIS — E1169 Type 2 diabetes mellitus with other specified complication: Secondary | ICD-10-CM | POA: Diagnosis not present

## 2021-01-17 DIAGNOSIS — Z1389 Encounter for screening for other disorder: Secondary | ICD-10-CM | POA: Diagnosis not present

## 2021-01-17 DIAGNOSIS — F331 Major depressive disorder, recurrent, moderate: Secondary | ICD-10-CM | POA: Diagnosis not present

## 2021-01-17 DIAGNOSIS — I700 Atherosclerosis of aorta: Secondary | ICD-10-CM | POA: Diagnosis not present

## 2021-01-17 DIAGNOSIS — G4733 Obstructive sleep apnea (adult) (pediatric): Secondary | ICD-10-CM | POA: Diagnosis not present

## 2021-01-17 DIAGNOSIS — Z794 Long term (current) use of insulin: Secondary | ICD-10-CM | POA: Diagnosis not present

## 2021-01-17 DIAGNOSIS — E039 Hypothyroidism, unspecified: Secondary | ICD-10-CM | POA: Diagnosis not present

## 2021-01-17 DIAGNOSIS — E7800 Pure hypercholesterolemia, unspecified: Secondary | ICD-10-CM | POA: Diagnosis not present

## 2021-01-17 DIAGNOSIS — J45909 Unspecified asthma, uncomplicated: Secondary | ICD-10-CM | POA: Diagnosis not present

## 2021-01-17 DIAGNOSIS — Z0000 Encounter for general adult medical examination without abnormal findings: Secondary | ICD-10-CM | POA: Diagnosis not present

## 2021-02-01 DIAGNOSIS — H401131 Primary open-angle glaucoma, bilateral, mild stage: Secondary | ICD-10-CM | POA: Diagnosis not present

## 2021-02-05 ENCOUNTER — Other Ambulatory Visit (HOSPITAL_COMMUNITY): Payer: Self-pay

## 2021-02-05 DIAGNOSIS — Z03818 Encounter for observation for suspected exposure to other biological agents ruled out: Secondary | ICD-10-CM | POA: Diagnosis not present

## 2021-02-05 DIAGNOSIS — J9801 Acute bronchospasm: Secondary | ICD-10-CM | POA: Diagnosis not present

## 2021-02-13 ENCOUNTER — Encounter (HOSPITAL_BASED_OUTPATIENT_CLINIC_OR_DEPARTMENT_OTHER): Payer: Self-pay | Admitting: *Deleted

## 2021-02-13 ENCOUNTER — Emergency Department (HOSPITAL_BASED_OUTPATIENT_CLINIC_OR_DEPARTMENT_OTHER)
Admission: EM | Admit: 2021-02-13 | Discharge: 2021-02-13 | Disposition: A | Discharge status: Home / Self Care | Payer: Medicare Other | Attending: Emergency Medicine | Admitting: Emergency Medicine

## 2021-02-13 ENCOUNTER — Other Ambulatory Visit: Payer: Self-pay

## 2021-02-13 ENCOUNTER — Emergency Department (HOSPITAL_BASED_OUTPATIENT_CLINIC_OR_DEPARTMENT_OTHER): Payer: Medicare Other | Admitting: Radiology

## 2021-02-13 DIAGNOSIS — F3341 Major depressive disorder, recurrent, in partial remission: Secondary | ICD-10-CM | POA: Diagnosis not present

## 2021-02-13 DIAGNOSIS — E119 Type 2 diabetes mellitus without complications: Secondary | ICD-10-CM | POA: Insufficient documentation

## 2021-02-13 DIAGNOSIS — J209 Acute bronchitis, unspecified: Secondary | ICD-10-CM | POA: Diagnosis not present

## 2021-02-13 DIAGNOSIS — Z20822 Contact with and (suspected) exposure to covid-19: Secondary | ICD-10-CM | POA: Diagnosis not present

## 2021-02-13 DIAGNOSIS — J40 Bronchitis, not specified as acute or chronic: Secondary | ICD-10-CM | POA: Diagnosis not present

## 2021-02-13 DIAGNOSIS — J4521 Mild intermittent asthma with (acute) exacerbation: Secondary | ICD-10-CM | POA: Insufficient documentation

## 2021-02-13 DIAGNOSIS — R059 Cough, unspecified: Secondary | ICD-10-CM | POA: Diagnosis not present

## 2021-02-13 LAB — CBC
HCT: 37.8 % (ref 36.0–46.0)
Hemoglobin: 12.5 g/dL (ref 12.0–15.0)
MCH: 27.5 pg (ref 26.0–34.0)
MCHC: 33.1 g/dL (ref 30.0–36.0)
MCV: 83.3 fL (ref 80.0–100.0)
Platelets: 347 10*3/uL (ref 150–400)
RBC: 4.54 MIL/uL (ref 3.87–5.11)
RDW: 16 % — ABNORMAL HIGH (ref 11.5–15.5)
WBC: 12.3 10*3/uL — ABNORMAL HIGH (ref 4.0–10.5)
nRBC: 0 % (ref 0.0–0.2)

## 2021-02-13 LAB — BASIC METABOLIC PANEL
Anion gap: 12 (ref 5–15)
BUN: 7 mg/dL — ABNORMAL LOW (ref 8–23)
CO2: 25 mmol/L (ref 22–32)
Calcium: 9.5 mg/dL (ref 8.9–10.3)
Chloride: 95 mmol/L — ABNORMAL LOW (ref 98–111)
Creatinine, Ser: 0.62 mg/dL (ref 0.44–1.00)
GFR, Estimated: >60 mL/min (ref >60)
Glucose, Bld: 107 mg/dL — ABNORMAL HIGH (ref 70–99)
Potassium: 3.8 mmol/L (ref 3.5–5.1)
Sodium: 132 mmol/L — ABNORMAL LOW (ref 135–145)

## 2021-02-13 LAB — RESP PANEL BY RT-PCR (FLU A&B, COVID) ARPGX2
Influenza A by PCR: NEGATIVE
Influenza B by PCR: NEGATIVE
SARS Coronavirus 2 by RT PCR: NEGATIVE

## 2021-02-13 MED ORDER — ALBUTEROL (5 MG/ML) CONTINUOUS INHALATION SOLN
10.0000 mg/h | INHALATION_SOLUTION | RESPIRATORY_TRACT | Status: DC
  Administered 2021-02-13: 10 mg/h via RESPIRATORY_TRACT
  Filled 2021-02-13: qty 20

## 2021-02-13 MED ORDER — PREDNISONE 10 MG (21) PO TBPK: ORAL_TABLET | Freq: Every day | ORAL | 0 refills | Status: DC

## 2021-02-13 MED ORDER — DOXYCYCLINE HYCLATE 100 MG PO CAPS: 100.0000 mg | ORAL_CAPSULE | Freq: Two times a day (BID) | ORAL | 0 refills | Status: DC

## 2021-02-13 MED ORDER — METHYLPREDNISOLONE SODIUM SUCC 125 MG IJ SOLR
125.0000 mg | Freq: Once | INTRAMUSCULAR | Status: AC
  Administered 2021-02-13: 125 mg via INTRAVENOUS
  Filled 2021-02-13: qty 2

## 2021-02-13 MED ORDER — DOXYCYCLINE HYCLATE 100 MG PO TABS
100.0000 mg | ORAL_TABLET | Freq: Once | ORAL | Status: AC
  Administered 2021-02-13: 100 mg via ORAL
  Filled 2021-02-13: qty 1

## 2021-02-13 MED ORDER — HYDROCOD POLST-CPM POLST ER 10-8 MG/5ML PO SUER: 5.0000 mL | Freq: Two times a day (BID) | ORAL | 0 refills | Status: DC | PRN

## 2021-02-13 MED ORDER — HYDROCOD POLST-CPM POLST ER 10-8 MG/5ML PO SUER
5.0000 mL | Freq: Once | ORAL | Status: AC
  Administered 2021-02-13: 5 mL via ORAL
  Filled 2021-02-13: qty 5

## 2021-02-13 NOTE — ED Notes (Signed)
Pt c/o lightheadedness/dizziness while being place on stretcher.

## 2021-02-13 NOTE — ED Notes (Signed)
Patient ambulated to restroom with minimal assistance.  Request crackers.  Given crackers and water with approval from Dr. Particia Nearing.  No further needs voiced at this time.

## 2021-02-13 NOTE — ED Notes (Signed)
Resp Therapist at Triage Bedside

## 2021-02-13 NOTE — ED Provider Notes (Signed)
[facility] EMERGENCY DEPT Provider Note   CSN: [id number] Arrival date & time: 02/13/21  1407     History Chief Complaint  Patient presents with   Cough    Kayla Ray is a 70 y.o. female.  Pt presents to the ED today with a cough for 12 days.  Pt has been on prednisone and zithromax.  She has been using her inhalers and robitussin without improvement in sx.  She has not been febrile.  She was tested for Covid (PCR) last week and it was neg.  She did a home test today and it was neg.  She is not able to sleep.      Past Medical History:  Diagnosis Date   Asthma    Diabetes mellitus without complication (HCC)     There are no problems to display for this patient.   Past Surgical History:  Procedure Laterality Date   ABDOMINAL HYSTERECTOMY     vaginal hysterectomy with ovarian preservation    BREAST BIOPSY Right 2019   CATARACT EXTRACTION     CESAREAN SECTION     x2   GALLBLADDER SURGERY       OB History     Gravida  2   Para  2   Term      Preterm      AB      Living  2      SAB      IAB      Ectopic      Multiple      Live Births              Family History  Problem Relation Age of Onset   Diabetes Mother    Heart disease Mother    Cancer Sister        lymphoma   Heart disease Sister    Diabetes Maternal Grandmother     Social History   Tobacco Use   Smoking status: Never   Smokeless tobacco: Never  Vaping Use   Vaping Use: Never used  Substance Use Topics   Alcohol use: Yes    Comment: OCC   Drug use: Never    Home Medications Prior to Admission medications   Medication Sig Start Date End Date Taking? Authorizing Provider  chlorpheniramine-HYDROcodone (TUSSIONEX PENNKINETIC ER) 10-8 MG/5ML SUER Take 5 mLs by mouth every 12 (twelve) hours as needed for cough. 02/13/21  Yes Jacalyn Lefevre, MD  doxycycline (VIBRAMYCIN) 100 MG capsule Take 1 capsule (100 mg total) by mouth 2 (two) times daily. 02/13/21   Yes Jacalyn Lefevre, MD  predniSONE (STERAPRED UNI-PAK 21 TAB) 10 MG (21) TBPK tablet Take by mouth daily. Take 6 tabs by mouth daily  for 2 days, then 5 tabs for 2 days, then 4 tabs for 2 days, then 3 tabs for 2 days, 2 tabs for 2 days, then 1 tab by mouth daily for 2 days 02/13/21  Yes Jacalyn Lefevre, MD  albuterol (PROVENTIL HFA;VENTOLIN HFA) 108 (90 Base) MCG/ACT inhaler Inhale 1-2 puffs into the lungs every 6 (six) hours as needed for wheezing or shortness of breath.    [provider]  albuterol (PROVENTIL) (2.5 MG/3ML) 0.083% nebulizer solution Take 2.5 mg by nebulization every 6 (six) hours as needed for wheezing or shortness of breath.    [provider]  albuterol (PROVENTIL) (2.5 MG/3ML) 0.083% nebulizer solution Take 3 mLs (2.5 mg total) by nebulization every 6 (six) hours as needed for wheezing or shortness of breath.  12/04/17   Nira Conn, MD  atorvastatin (LIPITOR) 10 MG tablet Take 10 mg by mouth daily. 11/03/17   [provider]  bimatoprost (LUMIGAN) 0.01 % SOLN 1 drop at bedtime.    [provider]  brimonidine-timolol (COMBIGAN) 0.2-0.5 % ophthalmic solution Place 1 drop into both eyes every 12 (twelve) hours.    [provider]  Dulaglutide 0.75 MG/0.5ML SOPN Inject into the skin.    [provider]  levothyroxine (SYNTHROID, LEVOTHROID) 50 MCG tablet Take 50 mcg by mouth daily before breakfast.    [provider]  MELATONIN PO Take 1 tablet by mouth at bedtime as needed (sleep).    [provider]  montelukast (SINGULAIR) 10 MG tablet Take 10 mg by mouth at bedtime.    [provider]  multivitamin-lutein (OCUVITE-LUTEIN) CAPS capsule Take 1 capsule by mouth daily.    [provider]  PRESCRIPTION MEDICATION 1 each daily.    [provider]  traZODone (DESYREL) 150 MG tablet Take 150 mg by mouth at bedtime.    [provider]  Vilazodone HCl 20 MG TABS Take 60 mg by  mouth daily.    [provider]    Allergies    Jardiance [empagliflozin], Metformin and related, Bactrim [sulfamethoxazole-trimethoprim], and Glimepiride  Review of Systems   Review of Systems  Respiratory:  Positive for cough and shortness of breath.   All other systems reviewed and are negative.  Physical Exam Updated Vital Signs BP (!) 165/68 (BP Location: Right Arm)   Pulse (!) 109   Temp 98.7 F (37.1 C)   Resp 18   Ht 4\' 11"  (1.499 m)   Wt 106.6 kg   SpO2 97%   BMI 47.46 kg/m   Physical Exam Vitals and nursing note reviewed.  Constitutional:      Appearance: Normal appearance. She is obese. She is ill-appearing.  HENT:     Head: Normocephalic and atraumatic.     Right Ear: External ear normal.     Left Ear: External ear normal.     Nose: Nose normal.     Mouth/Throat:     Mouth: Mucous membranes are moist.     Pharynx: Oropharynx is clear.  Eyes:     Extraocular Movements: Extraocular movements intact.     Conjunctiva/sclera: Conjunctivae normal.     Pupils: Pupils are equal, round, and reactive to light.  Cardiovascular:     Rate and Rhythm: Normal rate and regular rhythm.     Pulses: Normal pulses.     Heart sounds: Normal heart sounds.  Pulmonary:     Breath sounds: Wheezing present.     Comments: Frequent coughing Abdominal:     General: Abdomen is flat. Bowel sounds are normal.     Palpations: Abdomen is soft.  Musculoskeletal:        General: Normal range of motion.     Cervical back: Normal range of motion and neck supple.  Skin:    General: Skin is warm.     Capillary Refill: Capillary refill takes less than 2 seconds.  Neurological:     General: No focal deficit present.     Mental Status: She is alert and oriented to person, place, and time.  Psychiatric:        Mood and Affect: Mood normal.        Behavior: Behavior normal.    ED Results / Procedures / Treatments   Labs (all labs ordered are listed, but only abnormal  results  are displayed) Labs Reviewed  BASIC METABOLIC PANEL - Abnormal; Notable for the following components:      Result Value   Sodium 132 (*)    Chloride 95 (*)    Glucose, Bld 107 (*)    BUN 7 (*)    All other components within normal limits  CBC - Abnormal; Notable for the following components:   WBC 12.3 (*)    RDW 16.0 (*)    All other components within normal limits  RESP PANEL BY RT-PCR (FLU A&B, COVID) [id number]    EKG None  Radiology DG Chest 2 View  Result Date: 02/13/2021 CLINICAL DATA:  Productive cough. EXAM: CHEST - 2 VIEW COMPARISON:  None. FINDINGS: The heart size and mediastinal contours are within normal limits. Both lungs are clear. The visualized skeletal structures are unremarkable. IMPRESSION: No active cardiopulmonary disease. Electronically Signed   By: Lupita Raider M.D.   On: 02/13/2021 15:16    Procedures Procedures   Medications Ordered in ED Medications  albuterol (PROVENTIL,VENTOLIN) solution continuous neb (10 mg/hr Nebulization New Bag/Given 02/13/21 1657)  methylPREDNISolone sodium succinate (SOLU-MEDROL) 125 mg/2 mL injection 125 mg (125 mg Intravenous Given 02/13/21 1539)  chlorpheniramine-HYDROcodone (TUSSIONEX) 10-8 MG/5ML suspension 5 mL (5 mLs Oral Given 02/13/21 1538)  doxycycline (VIBRA-TABS) tablet 100 mg (100 mg Oral Given 02/13/21 1805)    ED Course  I have reviewed the triage vital signs and the nursing notes.  Pertinent labs & imaging results that were available during my care of the patient were reviewed by me and considered in my medical decision making (see chart for details).    MDM Rules/Calculators/A&P                           Pt is feeling much better after treatment.  She is negative for Covid.  CXR neg. Pt is d/c with doxy, tussionex, and prednisone.  She is referred to pulm for her asthma.  She is stable for d/c.  Return if worse.  Tranae Alvira was evaluated in Emergency Department on 02/13/2021 for the symptoms described in  the history of present illness. She was evaluated in the context of the global COVID-19 pandemic, which necessitated consideration that the patient might be at risk for infection with the SARS-CoV-2 virus that causes COVID-19. Institutional protocols and algorithms that pertain to the evaluation of patients at risk for COVID-19 are in a state of rapid change based on information released by regulatory bodies including the CDC and federal and state organizations. These policies and algorithms were followed during the patient's care in the ED.  Final Clinical Impression(s) / ED Diagnoses Final diagnoses:  Mild intermittent asthma with exacerbation  Bronchitis    Rx / DC Orders ED Discharge Orders          Ordered    doxycycline (VIBRAMYCIN) 100 MG capsule  2 times daily        02/13/21 1757    chlorpheniramine-HYDROcodone (TUSSIONEX PENNKINETIC ER) 10-8 MG/5ML SUER  Every 12 hours PRN        02/13/21 1757    predniSONE (STERAPRED UNI-PAK 21 TAB) 10 MG (21) TBPK tablet  Daily        02/13/21 1757    Ambulatory referral to Pulmonology        02/13/21 1757             Jacalyn Lefevre, MD 02/13/21 [phone]

## 2021-02-13 NOTE — ED Triage Notes (Signed)
Hx of Asthma, has strong non-prod cough, speech WNL, alert and oriented. Has been taking her meds at home, states now feels more Short of Breath and "Light Headed". Is taking prednisone and on a z-pak from her primary care MD

## 2021-02-13 NOTE — ED Notes (Signed)
Verbalized understanding discharge instructions, prescriptions, and follow-up. In no acute distress.   

## 2021-02-13 NOTE — ED Notes (Signed)
Patient states cough is better

## 2021-02-18 NOTE — Progress Notes (Signed)
Synopsis: Referred for asthma/copd by Jacalyn Lefevre, MD  Subjective:   PATIENT ID: Kayla Ray GENDER: female DOB: [birth year], MRN: [id number]  Chief Complaint  Patient presents with   Consult    Referred for asthma, states breathing better, denies sob, wheezing      70yF with chart history of adult-onset asthma, DM not requiring insulin, depression/anxiety, glaucoma, OSA on CPAP - actively follows with Dr. Earl Gala with [facility], had sinus surgery in the remote past for a deviated septum.  Her husband says that she often gets asthma exacerbations as she's getting over a cold, especially if she's not on top of her bronchodilators via nebulizer. When she has an exacerbation she coughs quite a bit, productive. Usually prednisone is helpful but less helpful during this acute illness. Tried azithromycin as well. DOE during an attack to less than a block.  She was then recently at [facility] for asthma exacerbation. She does think the doxycycline may have been helpful. Last dose of doxy and prednisone today. Productive cough is better.   She has some rhinorrhea but no sinonasal congestion, doesn't report frequent trouble with sinonasal congestion. In past she had more trouble with GERD and still takes prilosec periodically for it. Her asthma does flare up in spring/fall typically.    Inhalers tried previously: Albuterol, perhaps breo but she is unsure  Her father had lung cancer. Sister has COPD.  She has always done administrative work, Hotel manager. Have previously lived in [place], [place], [place], [place]. No pets/birds at home.          Otherwise pertinent review of systems is negative.  Past Medical History:  Diagnosis Date   Asthma    Diabetes mellitus without complication (HCC)      Family History  Problem Relation Age of Onset   Diabetes Mother    Heart disease Mother    Cancer Sister        lymphoma   Heart disease Sister    Diabetes  Maternal Grandmother      Past Surgical History:  Procedure Laterality Date   ABDOMINAL HYSTERECTOMY     vaginal hysterectomy with ovarian preservation    BREAST BIOPSY Right 2019   CATARACT EXTRACTION     CESAREAN SECTION     x2   GALLBLADDER SURGERY      Social History   Socioeconomic History   Marital status: Married    Spouse name: Not on file   Number of children: Not on file   Years of education: Not on file   Highest education level: Not on file  Occupational History   Not on file  Tobacco Use   Smoking status: Never   Smokeless tobacco: Never  Vaping Use   Vaping Use: Never used  Substance and Sexual Activity   Alcohol use: Yes    Comment: OCC   Drug use: Never   Sexual activity: Not Currently    Partners: Male    Comment: 1st intercourse- 1, married- 45 yrs   Other Topics Concern   Not on file  Social History Narrative   Not on file   Social Determinants of Health   Financial Resource Strain: Not on file  Food Insecurity: Not on file  Transportation Needs: Not on file  Physical Activity: Not on file  Stress: Not on file  Social Connections: Not on file  Intimate Partner Violence: Not on file     Allergies  Allergen Reactions   Jardiance [Empagliflozin]    Metformin  And Related Diarrhea   Bactrim [Sulfamethoxazole-Trimethoprim] Rash    Chest tightness   Glimepiride Rash     Outpatient Medications Prior to Visit  Medication Sig Dispense Refill   albuterol (PROVENTIL HFA;VENTOLIN HFA) 108 (90 Base) MCG/ACT inhaler Inhale 1-2 puffs into the lungs every 6 (six) hours as needed for wheezing or shortness of breath.     albuterol (PROVENTIL) (2.5 MG/3ML) 0.083% nebulizer solution Take 2.5 mg by nebulization every 6 (six) hours as needed for wheezing or shortness of breath.     atorvastatin (LIPITOR) 10 MG tablet Take 10 mg by mouth daily.  3   bimatoprost (LUMIGAN) 0.01 % SOLN 1 drop at bedtime.     brimonidine-timolol (COMBIGAN) 0.2-0.5 %  ophthalmic solution Place 1 drop into both eyes every 12 (twelve) hours.     chlorpheniramine-HYDROcodone (TUSSIONEX PENNKINETIC ER) 10-8 MG/5ML SUER Take 5 mLs by mouth every 12 (twelve) hours as needed for cough. 140 mL 0   doxycycline (VIBRAMYCIN) 100 MG capsule Take 1 capsule (100 mg total) by mouth 2 (two) times daily. 14 capsule 0   Dulaglutide 0.75 MG/0.5ML SOPN Inject into the skin.     levothyroxine (SYNTHROID, LEVOTHROID) 50 MCG tablet Take 50 mcg by mouth daily before breakfast.     MELATONIN PO Take 1 tablet by mouth at bedtime as needed (sleep).     montelukast (SINGULAIR) 10 MG tablet Take 10 mg by mouth at bedtime.     multivitamin-lutein (OCUVITE-LUTEIN) CAPS capsule Take 1 capsule by mouth daily.     PRESCRIPTION MEDICATION 1 each daily.     traZODone (DESYREL) 150 MG tablet Take 150 mg by mouth at bedtime.     Vilazodone HCl 20 MG TABS Take 60 mg by mouth daily.     albuterol (PROVENTIL) (2.5 MG/3ML) 0.083% nebulizer solution Take 3 mLs (2.5 mg total) by nebulization every 6 (six) hours as needed for wheezing or shortness of breath. 75 mL 0   predniSONE (STERAPRED UNI-PAK 21 TAB) 10 MG (21) TBPK tablet Take by mouth daily. Take 6 tabs by mouth daily  for 2 days, then 5 tabs for 2 days, then 4 tabs for 2 days, then 3 tabs for 2 days, 2 tabs for 2 days, then 1 tab by mouth daily for 2 days 42 tablet 0   No facility-administered medications prior to visit.       Objective:   Physical Exam:  General appearance: 70 y.o., female, NAD, conversant  Eyes: anicteric sclerae, moist conjunctivae; no lid-lag; PERRL, tracking appropriately HENT: NCAT; oropharynx, MMM, no mucosal ulcerations; normal hard and soft palate Neck: Trachea midline; no lymphadenopathy, no JVD Lungs: CTAB, no crackles, no wheeze, with normal respiratory effort CV: RRR, no MRGs  Abdomen: Soft, non-tender; non-distended, BS present  Extremities: No peripheral edema, radial and DP pulses present bilaterally   Skin: Normal temperature, turgor and texture; no rash Psych: Appropriate affect Neuro: Alert and oriented to person and place, no focal deficit    Vitals:   02/19/21 0905  BP: 122/72  Pulse: 78  Temp: 98.4 F (36.9 C)  TempSrc: Oral  SpO2: 98%  Weight: 236 lb (107 kg)  Height: 4\' 11"  (1.499 m)   98% on RA BMI Readings from Last 3 Encounters:  02/19/21 47.67 kg/m  02/13/21 47.46 kg/m  07/26/20 47.87 kg/m   Wt Readings from Last 3 Encounters:  02/19/21 236 lb (107 kg)  02/13/21 235 lb (106.6 kg)  07/26/20 237 lb (107.5 kg)     CBC  Component Value Date/Time   WBC 12.3 (H) 02/13/2021 1545   RBC 4.54 02/13/2021 1545   HGB 12.5 02/13/2021 1545   HCT 37.8 02/13/2021 1545   PLT 347 02/13/2021 1545   MCV 83.3 02/13/2021 1545   MCH 27.5 02/13/2021 1545   MCHC 33.1 02/13/2021 1545   RDW 16.0 (H) 02/13/2021 1545     Chest Imaging: CXR 8/16 with ?RML mucoid impaction  Pulmonary Functions Testing Results: No flowsheet data found.     Assessment & Plan:   # Cough # Dyspnea on exertion # Reported history of asthma Could be consistent with mild intermittent asthma. Also sounds like she currently has symptomatic rhinitis would could be perpetuating her symptoms. Questionable RML mucoid impaction on CXR will add flutter valve for airway clearance and after reviewing PFTs and gauging chronicity of productive cough will consider CT Chest for better characterization.  # Daytime sleepiness # History of OSA Thyroid ok per pt. Has a leaky CPAP mask.  Plan: - PFTs in a month to allow to recover after recent exacerbation - flonase  - as needed albuterol inhaler or nebulizer for now followed by flutter valve 10 slow but firm puffs twice daily   - ok to continue singulair for now - If PFTs supportive of asthma then would trial LABA/ICS with SMART inhaler strategy for mild intermittent asthma - encouraged her to reach out to her sleep physician to have them review device  download and ensure her sleep-disordered breathing is adequately treated       Omar Person, MD [facility] 02/19/2021 9:12 AM

## 2021-02-19 ENCOUNTER — Ambulatory Visit (INDEPENDENT_AMBULATORY_CARE_PROVIDER_SITE_OTHER): Payer: Medicare Other | Admitting: Student

## 2021-02-19 ENCOUNTER — Other Ambulatory Visit: Payer: Self-pay

## 2021-02-19 ENCOUNTER — Encounter: Payer: Self-pay | Admitting: Student

## 2021-02-19 VITALS — BP 122/72 | HR 78 | Temp 98.4°F | Ht 59.0 in | Wt 236.0 lb

## 2021-02-19 DIAGNOSIS — R0609 Other forms of dyspnea: Secondary | ICD-10-CM

## 2021-02-19 DIAGNOSIS — R053 Chronic cough: Secondary | ICD-10-CM | POA: Diagnosis not present

## 2021-02-19 DIAGNOSIS — R0600 Dyspnea, unspecified: Secondary | ICD-10-CM | POA: Diagnosis not present

## 2021-02-19 NOTE — Patient Instructions (Signed)
-   Flonase - 1 spray on each side of your nose twice a day for first week, then 1 spray on each side.   Instructions for use: If you also use a saline nasal spray or rinse, use that first. Position the head with the chin slightly tucked. Use the right hand to spray into the left nostril and the right hand to spray into the left nostril.   Point the bottle away from the septum of your nose (cartilage that divides the two sides of your nose).  Hold the nostril closed on the opposite side from where you will spray Spray once and gently sniff to pull the medicine into the higher parts of your nose.  Don't sniff too hard as the medicine will drain down the back of your throat instead. Repeat with a second spray on the same side if prescribed. Repeat on the other side of your nose.   - After using your albuterol nebulizer then use flutter valve 10 slow but firm puffs twice daily to help cough up all that mucus - PFTs in a month and I will call with results - See you in 3 months

## 2021-02-21 DIAGNOSIS — D649 Anemia, unspecified: Secondary | ICD-10-CM | POA: Diagnosis not present

## 2021-03-12 ENCOUNTER — Telehealth: Payer: Self-pay | Admitting: Student

## 2021-03-12 NOTE — Telephone Encounter (Signed)
Called and spoke with Patient.  Advised flutter valve was ordered from DME, because currently office is out of stock.  Understanding stated.  Nothing further at this time.

## 2021-03-22 ENCOUNTER — Other Ambulatory Visit: Payer: Self-pay

## 2021-03-22 ENCOUNTER — Ambulatory Visit (INDEPENDENT_AMBULATORY_CARE_PROVIDER_SITE_OTHER): Payer: Medicare Other | Admitting: Student

## 2021-03-22 DIAGNOSIS — R053 Chronic cough: Secondary | ICD-10-CM

## 2021-03-22 LAB — PULMONARY FUNCTION TEST
DL/VA % pred: 123 %
DL/VA: 5.32 ml/min/mmHg/L
DLCO cor % pred: 95 %
DLCO cor: 15.56 ml/min/mmHg
DLCO unc % pred: 92 %
DLCO unc: 15.11 ml/min/mmHg
FEF 25-75 Post: 1.68 L/sec
FEF 25-75 Pre: 1.61 L/sec
FEF2575-%Change-Post: 4 %
FEF2575-%Pred-Post: 100 %
FEF2575-%Pred-Pre: 96 %
FEV1-%Change-Post: 1 %
FEV1-%Pred-Post: 89 %
FEV1-%Pred-Pre: 88 %
FEV1-Post: 1.65 L
FEV1-Pre: 1.62 L
FEV1FVC-%Change-Post: 1 %
FEV1FVC-%Pred-Pre: 108 %
FEV6-%Change-Post: 0 %
FEV6-%Pred-Post: 84 %
FEV6-%Pred-Pre: 84 %
FEV6-Post: 1.96 L
FEV6-Pre: 1.96 L
FEV6FVC-%Pred-Post: 104 %
FEV6FVC-%Pred-Pre: 104 %
FVC-%Change-Post: 0 %
FVC-%Pred-Post: 80 %
FVC-%Pred-Pre: 80 %
FVC-Post: 1.96 L
FVC-Pre: 1.96 L
Post FEV1/FVC ratio: 84 %
Post FEV6/FVC ratio: 100 %
Pre FEV1/FVC ratio: 83 %
Pre FEV6/FVC Ratio: 100 %
RV % pred: 74 %
RV: 1.42 L
TLC % pred: 85 %
TLC: 3.67 L

## 2021-03-22 NOTE — Progress Notes (Signed)
PFT done today. 

## 2021-03-27 ENCOUNTER — Telehealth: Payer: Self-pay | Admitting: Student

## 2021-03-27 NOTE — Telephone Encounter (Signed)
Sent following mychart message  You did great! Normal lung function. If at next visit, still dealing with cough we can consider methacholine challenge (more sensitive test for airway hyperresponsiveness - a key feature of asthma).

## 2021-03-27 NOTE — Telephone Encounter (Signed)
Called and spoke with patient who is calling because she would like results of PFT done on 03/22/21  Dr. Thora Lance please advise

## 2021-03-28 NOTE — Telephone Encounter (Signed)
Called and spoke with patient to let her know we got her message about her breathing and to see if she wanted an appt since dr. Thora Lance is on vacation for the next week. She stated that she would like to be seen because she is getting short of breath with little exertion. She states that she currently only on rescue inhaler and is prescribed nebulizer medication and wants to talk moe about it. Patient is not scheduled for OV with [facility]. Nothing further needed at this time.   Next Appt With Pulmonology Glenford Bayley, NP)03/30/2021 at  2:30 PM

## 2021-03-30 ENCOUNTER — Ambulatory Visit (INDEPENDENT_AMBULATORY_CARE_PROVIDER_SITE_OTHER): Payer: Medicare Other | Admitting: Primary Care

## 2021-03-30 ENCOUNTER — Other Ambulatory Visit: Payer: Self-pay

## 2021-03-30 ENCOUNTER — Encounter: Payer: Self-pay | Admitting: Primary Care

## 2021-03-30 VITALS — BP 124/80 | HR 84 | Temp 98.2°F | Ht 59.0 in | Wt 242.0 lb

## 2021-03-30 DIAGNOSIS — R053 Chronic cough: Secondary | ICD-10-CM

## 2021-03-30 DIAGNOSIS — R0600 Dyspnea, unspecified: Secondary | ICD-10-CM | POA: Diagnosis not present

## 2021-03-30 DIAGNOSIS — Z8709 Personal history of other diseases of the respiratory system: Secondary | ICD-10-CM | POA: Diagnosis not present

## 2021-03-30 DIAGNOSIS — R0609 Other forms of dyspnea: Secondary | ICD-10-CM

## 2021-03-30 LAB — NITRIC OXIDE: Nitric Oxide: 19

## 2021-03-30 NOTE — Patient Instructions (Addendum)
We may get additional cardiac testing as well as something called methacholine challenge to definitely rule in/out asthma  FENO was normal today   Try using albuterol prior to an activity that causes you to become short of breath and see if this makes any differences   Orders: Methacholine challenge (ordered) FENO re: cough/asthma   Follow-up: 2 months with Dr. Thora Lance

## 2021-03-30 NOTE — Progress Notes (Signed)
@Patient  ID: , female    DOB: 1951-05-26, 70 y.o.   MRN: [id number]  Chief Complaint  Patient presents with  . Follow-up    Patient reports she feels short of breath with exertion.     Referring provider: [id number], MD  HPI: 70 year old female, never smoked. PMH significant for hx asthma. Patient of Dr. 78, seen for initial consult on 02/19/21 for dyspnea on exertion and cough. Maintained on Singulair 10mg  daily. Ordered for PFTs. Given flutter valve.  If still has cough, consider methacholine challenge and/or CT chest   Previous [facility] pulmonary encounter: 02/19/21 - Dr.  70yF with chart history of adult-onset asthma, DM not requiring insulin, depression/anxiety, glaucoma, OSA on CPAP - actively follows with Dr. 02/21/21 with [facility], had sinus surgery in the remote past for a deviated septum.   Her husband says that she often gets asthma exacerbations as she's getting over a cold, especially if she's not on top of her bronchodilators via nebulizer. When she has an exacerbation she coughs quite a bit, productive. Usually prednisone is helpful but less helpful during this acute illness. Tried azithromycin as well. DOE during an attack to less than a block.   She was then recently at [facility] for asthma exacerbation. She does think the doxycycline may have been helpful. Last dose of doxy and prednisone today. Productive cough is better.    She has some rhinorrhea but no sinonasal congestion, doesn't report frequent trouble with sinonasal congestion. In past she had more trouble with GERD and still takes prilosec periodically for it. Her asthma does flare up in spring/fall typically.      Inhalers tried previously: Albuterol, perhaps breo but she is unsure   Her father had lung cancer. Sister has COPD.   She has always done administrative work, [employer]. Have previously lived in [place], [place], [place], [place]. No pets/birds at home.      03/30/2021- Interim hx  Patient presents today for 1 month follow-up DOE. She reports mild dyspnea with exertion. Cough has resolved. Pulmonary function testing showed normal lung function. She gets winded walking more than 150-225ft. Symptoms have been present since she had bronchitis in August.She does not notice any wheezing or chest tightness symptoms. CXR on 02/13/21 showed no active cardiopulmonary disease. FENO was normal today.    Pulmonary function testing: 03/22/21- FVC 1.96 (80%), FEV1 1.65 (89%), ratio 84, TLC 85%, DLCOcor 15.96 (95%) Normal pulmonary function   03/30/2021 FENO 19   Allergies  Allergen Reactions  . Bactrim [Sulfamethoxazole-Trimethoprim] Rash    Chest tightness  . Glimepiride Rash  . Jardiance [Empagliflozin] Other (See Comments)  . Metformin And Related Diarrhea    Immunization History  Administered Date(s) Administered  . PFIZER(Purple Top)SARS-COV-2 Vaccination 07/08/2019, 07/26/2019, 05/12/2020  . Zoster Recombinat (Shingrix) 04/07/2019    Past Medical History:  Diagnosis Date  . Asthma   . Diabetes mellitus without complication (HCC)     Tobacco History: Social History   Tobacco Use  Smoking Status Never  Smokeless Tobacco Never   Counseling given: Not Answered   Outpatient Medications Prior to Visit  Medication Sig Dispense Refill  . albuterol (PROVENTIL HFA;VENTOLIN HFA) 108 (90 Base) MCG/ACT inhaler Inhale 1-2 puffs into the lungs every 6 (six) hours as needed for wheezing or shortness of breath.    13/05/2020 albuterol (PROVENTIL) (2.5 MG/3ML) 0.083% nebulizer solution Take 2.5 mg by nebulization every 6 (six) hours as needed for wheezing or shortness of breath.    06/07/2019  atorvastatin (LIPITOR) 10 MG tablet Take 10 mg by mouth daily.  3  . bimatoprost (LUMIGAN) 0.01 % SOLN 1 drop at bedtime.    . brimonidine-timolol (COMBIGAN) 0.2-0.5 % ophthalmic solution Place 1 drop into both eyes every 12 (twelve) hours.    . Dulaglutide 0.75 MG/0.5ML SOPN  Inject into the skin.    Marland Kitchen levothyroxine (SYNTHROID, LEVOTHROID) 50 MCG tablet Take 50 mcg by mouth daily before breakfast.    . MELATONIN PO Take 1 tablet by mouth at bedtime as needed (sleep).    . montelukast (SINGULAIR) 10 MG tablet Take 10 mg by mouth at bedtime.    . multivitamin-lutein (OCUVITE-LUTEIN) CAPS capsule Take 1 capsule by mouth daily.    Marland Kitchen PRESCRIPTION MEDICATION 1 each daily.    . traZODone (DESYREL) 150 MG tablet Take 150 mg by mouth at bedtime.    Marland Kitchen venlafaxine XR (EFFEXOR-XR) 150 MG 24 hr capsule Take 150 mg by mouth 2 (two) times daily.    . chlorpheniramine-HYDROcodone (TUSSIONEX PENNKINETIC ER) 10-8 MG/5ML SUER Take 5 mLs by mouth every 12 (twelve) hours as needed for cough. (Patient not taking: Reported on 03/30/2021) 140 mL 0  . doxycycline (VIBRAMYCIN) 100 MG capsule Take 1 capsule (100 mg total) by mouth 2 (two) times daily. (Patient not taking: Reported on 03/30/2021) 14 capsule 0  . EFFEXOR XR 150 MG 24 hr capsule Take 150 mg by mouth 2 (two) times daily.    . Vilazodone HCl 20 MG TABS Take 60 mg by mouth daily. (Patient not taking: Reported on 03/30/2021)     No facility-administered medications prior to visit.      Review of Systems  Review of Systems  Constitutional: Negative.   HENT: Negative.    Respiratory:  Positive for shortness of breath. Negative for cough, chest tightness and wheezing.   Cardiovascular: Negative.     Physical Exam  BP 124/80 (BP Location: Left Arm, Patient Position: Sitting, Cuff Size: Normal)   Pulse 84   Temp 98.2 F (36.8 C) (Oral)   Ht 4\' 11"  (1.499 m)   Wt 242 lb (109.8 kg)   SpO2 98%   BMI 48.88 kg/m  Physical Exam Constitutional:      Appearance: Normal appearance. She is obese.  HENT:     Head: Normocephalic and atraumatic.     Mouth/Throat:     Mouth: Mucous membranes are moist.     Pharynx: Oropharynx is clear.  Cardiovascular:     Rate and Rhythm: Normal rate and regular rhythm.     Heart sounds: No  murmur heard.    Comments: RRR Pulmonary:     Effort: Pulmonary effort is normal.     Breath sounds: Normal breath sounds.     Comments: CTA Musculoskeletal:        General: Normal range of motion.  Skin:    General: Skin is warm and dry.  Neurological:     General: No focal deficit present.     Mental Status: She is alert and oriented to person, place, and time. Mental status is at baseline.  Psychiatric:        Mood and Affect: Mood normal.        Behavior: Behavior normal.        Thought Content: Thought content normal.        Judgment: Judgment normal.     Lab Results:  CBC    Component Value Date/Time   WBC 12.3 (H) 02/13/2021 1545   RBC 4.54 02/13/2021  1545   HGB 12.5 02/13/2021 1545   HCT 37.8 02/13/2021 1545   PLT 347 02/13/2021 1545   MCV 83.3 02/13/2021 1545   MCH 27.5 02/13/2021 1545   MCHC 33.1 02/13/2021 1545   RDW 16.0 (H) 02/13/2021 1545    BMET    Component Value Date/Time   NA 132 (L) 02/13/2021 1545   K 3.8 02/13/2021 1545   CL 95 (L) 02/13/2021 1545   CO2 25 02/13/2021 1545   GLUCOSE 107 (H) 02/13/2021 1545   BUN 7 (L) 02/13/2021 1545   CREATININE 0.62 02/13/2021 1545   CALCIUM 9.5 02/13/2021 1545   GFRNONAA >60 02/13/2021 1545   GFRAA >60 12/03/2017 2302    BNP No results found for: BNP  ProBNP No results found for: PROBNP  Imaging: No results found.   Assessment & Plan:   Hx of intrinsic asthma - Patient reportedly was diagnosed with asthma 20 years ago. Unclear if she has a true diagnosis. She developed dyspnea with exertion symptoms after having bronchitis in August. Cough has resolved.  She is on prn albuterol. PFTs were normal without overt restrictive or obstructive airway disease, no BD response. FENO was also normal. Recommend checking methacholine challenge to rule in/out asthma as caused of dyspnea. She may need additional cardiac work up to ensure symptoms are not due to underling cardiac disease. Recommend she try using  albuterol hfa prior to an activity that causes shortnrss of breath and see if this makes any differences. If benefit would trial ICS/LABA.    Glenford Bayley, NP 03/30/2021

## 2021-03-30 NOTE — Assessment & Plan Note (Addendum)
-   Patient reportedly was diagnosed with asthma 20 years ago. Unclear if she has a true diagnosis. She developed dyspnea with exertion symptoms after having bronchitis in August. Cough has resolved.  She is on prn albuterol. PFTs were normal without overt restrictive or obstructive airway disease, no BD response. FENO was also normal. Recommend checking methacholine challenge to rule in/out asthma as caused of dyspnea. She may need additional cardiac work up to ensure symptoms are not due to underling cardiac disease. Recommend she try using albuterol hfa prior to an activity that causes shortnrss of breath and see if this makes any differences. If benefit would trial ICS/LABA.

## 2021-04-04 ENCOUNTER — Telehealth: Payer: Self-pay | Admitting: Primary Care

## 2021-04-04 DIAGNOSIS — R0609 Other forms of dyspnea: Secondary | ICD-10-CM

## 2021-04-04 NOTE — Telephone Encounter (Signed)
Attempted to call pt but unable to reach. Left message for her to return call. 

## 2021-04-04 NOTE — Telephone Encounter (Signed)
Please let patient know I discussed dyspnea symptoms with Dr. Thora Lance and he recommended stress echocardiogram (ask if she can walk on treadmill, if unable will need dobutamine) in addition to methacholine challenge that has already been ordered. She will need a follow-up with him after completing testing

## 2021-04-06 ENCOUNTER — Other Ambulatory Visit: Payer: Self-pay | Admitting: Student

## 2021-04-07 LAB — SARS CORONAVIRUS 2 (TAT 6-24 HRS): SARS Coronavirus 2: NEGATIVE

## 2021-04-09 NOTE — Telephone Encounter (Addendum)
Attempted to call pt but unable to reach. Left message for her to return call. Per protocol due to multiple attempts, will close encounter.

## 2021-04-10 ENCOUNTER — Ambulatory Visit (HOSPITAL_COMMUNITY)
Admission: RE | Admit: 2021-04-10 | Discharge: 2021-04-10 | Disposition: A | Discharge status: Home / Self Care | Payer: Medicare Other | Source: Ambulatory Visit | Attending: Primary Care | Admitting: Primary Care

## 2021-04-10 ENCOUNTER — Other Ambulatory Visit: Payer: Self-pay

## 2021-04-10 DIAGNOSIS — R0609 Other forms of dyspnea: Secondary | ICD-10-CM | POA: Diagnosis not present

## 2021-04-10 DIAGNOSIS — R053 Chronic cough: Secondary | ICD-10-CM | POA: Diagnosis not present

## 2021-04-10 LAB — PULMONARY FUNCTION TEST
FEF 25-75 Post: 1.48 L/sec
FEF 25-75 Pre: 1.55 L/sec
FEF2575-%Change-Post: -3 %
FEF2575-%Pred-Post: 88 %
FEF2575-%Pred-Pre: 92 %
FEV1-%Change-Post: -1 %
FEV1-%Pred-Post: 84 %
FEV1-%Pred-Pre: 85 %
FEV1-Post: 1.55 L
FEV1-Pre: 1.58 L
FEV1FVC-%Change-Post: 0 %
FEV1FVC-%Pred-Pre: 106 %
FEV6-%Change-Post: -2 %
FEV6-%Pred-Post: 81 %
FEV6-%Pred-Pre: 83 %
FEV6-Post: 1.89 L
FEV6-Pre: 1.94 L
FEV6FVC-%Pred-Post: 104 %
FEV6FVC-%Pred-Pre: 104 %
FVC-%Change-Post: -2 %
FVC-%Pred-Post: 77 %
FVC-%Pred-Pre: 79 %
FVC-Post: 1.89 L
FVC-Pre: 1.94 L
Post FEV1/FVC ratio: 82 %
Post FEV6/FVC ratio: 100 %
Pre FEV1/FVC ratio: 81 %
Pre FEV6/FVC Ratio: 100 %

## 2021-04-10 MED ORDER — ALBUTEROL SULFATE (2.5 MG/3ML) 0.083% IN NEBU
2.5000 mg | INHALATION_SOLUTION | Freq: Once | RESPIRATORY_TRACT | Status: AC
  Administered 2021-04-10: 2.5 mg via RESPIRATORY_TRACT

## 2021-04-10 MED ORDER — METHACHOLINE 1 MG/ML NEB SOLN
3.0000 mL | Freq: Once | RESPIRATORY_TRACT | Status: AC
  Administered 2021-04-10: 3 mg via RESPIRATORY_TRACT
  Filled 2021-04-10: qty 3

## 2021-04-10 MED ORDER — METHACHOLINE 0.0625 MG/ML NEB SOLN
3.0000 mL | Freq: Once | RESPIRATORY_TRACT | Status: AC
  Administered 2021-04-10: 0.0625 mg via RESPIRATORY_TRACT
  Filled 2021-04-10: qty 3

## 2021-04-10 MED ORDER — METHACHOLINE 4 MG/ML NEB SOLN
3.0000 mL | Freq: Once | RESPIRATORY_TRACT | Status: AC
  Administered 2021-04-10: 12 mg via RESPIRATORY_TRACT
  Filled 2021-04-10: qty 3

## 2021-04-10 MED ORDER — METHACHOLINE 16 MG/ML NEB SOLN
3.0000 mL | Freq: Once | RESPIRATORY_TRACT | Status: AC
  Administered 2021-04-10: 48 mg via RESPIRATORY_TRACT
  Filled 2021-04-10: qty 3

## 2021-04-10 MED ORDER — METHACHOLINE 0 MG/ML NEB SOLN
3.0000 mL | Freq: Once | RESPIRATORY_TRACT | Status: AC
  Administered 2021-04-10: 3 mL via RESPIRATORY_TRACT
  Filled 2021-04-10: qty 3

## 2021-04-10 MED ORDER — METHACHOLINE 0.25 MG/ML NEB SOLN
3.0000 mL | Freq: Once | RESPIRATORY_TRACT | Status: AC
  Administered 2021-04-10: 0.75 mg via RESPIRATORY_TRACT
  Filled 2021-04-10: qty 3

## 2021-04-11 ENCOUNTER — Other Ambulatory Visit: Payer: Self-pay | Admitting: Primary Care

## 2021-04-11 DIAGNOSIS — R0609 Other forms of dyspnea: Secondary | ICD-10-CM

## 2021-04-12 DIAGNOSIS — H401131 Primary open-angle glaucoma, bilateral, mild stage: Secondary | ICD-10-CM | POA: Diagnosis not present

## 2021-04-13 ENCOUNTER — Other Ambulatory Visit: Payer: Self-pay | Admitting: Student

## 2021-04-13 DIAGNOSIS — R0609 Other forms of dyspnea: Secondary | ICD-10-CM

## 2021-04-16 NOTE — Progress Notes (Signed)
Please let patient know methacholine challenge was consistent with asthma dx. She has a follow up in November with Dr. Thora Lance can discuss results further then and additional treatment if needed, is she doing alright for now?

## 2021-04-20 ENCOUNTER — Other Ambulatory Visit: Payer: Self-pay | Admitting: Student

## 2021-04-20 DIAGNOSIS — R0609 Other forms of dyspnea: Secondary | ICD-10-CM

## 2021-04-25 ENCOUNTER — Other Ambulatory Visit: Payer: Self-pay | Admitting: Student

## 2021-04-25 DIAGNOSIS — R0609 Other forms of dyspnea: Secondary | ICD-10-CM

## 2021-05-02 ENCOUNTER — Telehealth: Payer: Self-pay | Admitting: Student

## 2021-05-02 NOTE — Telephone Encounter (Signed)
-----   Message from Christen Butter, CMA sent at 05/02/2021  1:51 PM EDT ----- Regarding: FW: STILL PENDING ATTESTATION Dr Thora Lance, will you please sign the order I routed you on this pt. This is so that they can scheduled her stress ECHO. Thanks! ----- Message ----- From: Minette Brine Sent: 05/02/2021   9:05 AM EDT To: Christen Butter, CMA Subject: STILL PENDING ATTESTATION                      Still need ATTESTATION SIGNWED in order to schedule please.

## 2021-05-02 NOTE — Progress Notes (Signed)
Encounter for consent for stress echo

## 2021-05-09 DIAGNOSIS — H401131 Primary open-angle glaucoma, bilateral, mild stage: Secondary | ICD-10-CM | POA: Diagnosis not present

## 2021-05-09 NOTE — Progress Notes (Signed)
Synopsis: Referred for asthma/copd by Georgann Housekeeper, MD  Subjective:   PATIENT ID: Kayla Ray GENDER: female DOB: 07/05/1950, MRN: [id number]  Chief Complaint  Patient presents with   Follow-up    Follow up on ER visit    70yF with chart history of adult-onset asthma, DM not requiring insulin, depression/anxiety, glaucoma, OSA on CPAP - actively follows with Dr. Earl Gala with [facility], had sinus surgery in the remote past for a deviated septum.  Her husband says that she often gets asthma exacerbations as she's getting over a cold, especially if she's not on top of her bronchodilators via nebulizer. When she has an exacerbation she coughs quite a bit, productive. Usually prednisone is helpful but less helpful during this acute illness. Tried azithromycin as well. DOE during an attack to less than a block.  She was then recently at [facility] for asthma exacerbation. She does think the doxycycline may have been helpful. Last dose of doxy and prednisone today. Productive cough is better.   She has some rhinorrhea but no sinonasal congestion, doesn't report frequent trouble with sinonasal congestion. In past she had more trouble with GERD and still takes prilosec periodically for it. Her asthma does flare up in spring/fall typically.    Inhalers tried previously: Albuterol, perhaps breo but she is unsure  Her father had lung cancer. Sister has COPD.  She has always done administrative work, [employer]. Have previously lived in [place], [place], [place], [place]. No pets/birds at home.     Interval HPI: Seen by Buelah Manis in clinic 9/30. Consideration given to stress testing given more prominent DOE at that time. Had methacholine challenge with borderline AHR 10/11. DOE apparently has improved somewhat. Was still on montelukast at time of methacholine challenge.  She says she's basically back to baseline with regard to DOE. Hard to quantify DOE due to competing hip  pain. Cough is better. No fever, CP, orthopnea.   Seems like major trigger is transition to fall season.   Otherwise pertinent review of systems is negative.  Past Medical History:  Diagnosis Date   Asthma    Diabetes mellitus without complication (HCC)      Family History  Problem Relation Age of Onset   Diabetes Mother    Heart disease Mother    Cancer Sister        lymphoma   Heart disease Sister    Diabetes Maternal Grandmother      Past Surgical History:  Procedure Laterality Date   ABDOMINAL HYSTERECTOMY     vaginal hysterectomy with ovarian preservation    BREAST BIOPSY Right 2019   CATARACT EXTRACTION     CESAREAN SECTION     x2   GALLBLADDER SURGERY      Social History   Socioeconomic History   Marital status: Married    Spouse name: Not on file   Number of children: Not on file   Years of education: Not on file   Highest education level: Not on file  Occupational History   Not on file  Tobacco Use   Smoking status: Never   Smokeless tobacco: Never  Vaping Use   Vaping Use: Never used  Substance and Sexual Activity   Alcohol use: Yes    Comment: OCC   Drug use: Never   Sexual activity: Not Currently    Partners: Male    Comment: 1st intercourse- 66, married- 45 yrs   Other Topics Concern   Not on file  Social History  Narrative   Not on file   Social Determinants of Health   Financial Resource Strain: Not on file  Food Insecurity: Not on file  Transportation Needs: Not on file  Physical Activity: Not on file  Stress: Not on file  Social Connections: Not on file  Intimate Partner Violence: Not on file     Allergies  Allergen Reactions   Bactrim [Sulfamethoxazole-Trimethoprim] Rash    Chest tightness   Glimepiride Rash   Jardiance [Empagliflozin] Other (See Comments)   Metformin And Related Diarrhea     Outpatient Medications Prior to Visit  Medication Sig Dispense Refill   albuterol (PROVENTIL HFA;VENTOLIN HFA) 108 (90 Base)  MCG/ACT inhaler Inhale 1-2 puffs into the lungs every 6 (six) hours as needed for wheezing or shortness of breath.     albuterol (PROVENTIL) (2.5 MG/3ML) 0.083% nebulizer solution Take 2.5 mg by nebulization every 6 (six) hours as needed for wheezing or shortness of breath.     atorvastatin (LIPITOR) 10 MG tablet Take 10 mg by mouth daily.  3   bimatoprost (LUMIGAN) 0.01 % SOLN 1 drop at bedtime.     brimonidine-timolol (COMBIGAN) 0.2-0.5 % ophthalmic solution Place 1 drop into both eyes every 12 (twelve) hours.     Dulaglutide 0.75 MG/0.5ML SOPN Inject into the skin.     levothyroxine (SYNTHROID, LEVOTHROID) 50 MCG tablet Take 50 mcg by mouth daily before breakfast.     MELATONIN PO Take 1 tablet by mouth at bedtime as needed (sleep).     montelukast (SINGULAIR) 10 MG tablet Take 10 mg by mouth at bedtime.     multivitamin-lutein (OCUVITE-LUTEIN) CAPS capsule Take 1 capsule by mouth daily.     PRESCRIPTION MEDICATION 1 each daily.     traZODone (DESYREL) 150 MG tablet Take 150 mg by mouth at bedtime.     venlafaxine XR (EFFEXOR-XR) 150 MG 24 hr capsule Take 150 mg by mouth 2 (two) times daily.     No facility-administered medications prior to visit.       Objective:   Physical Exam:  General appearance: 70 y.o., female, NAD, conversant  Eyes: anicteric sclerae, moist conjunctivae; no lid-lag; PERRL, tracking appropriately HENT: NCAT; oropharynx, MMM, no mucosal ulcerations; normal hard and soft palate Neck: Trachea midline; no lymphadenopathy, no JVD Lungs: CTAB, no crackles, no wheeze, with normal respiratory effort CV: RRR, no MRGs  Abdomen: Soft, non-tender; non-distended, BS present  Extremities: No peripheral edema, radial and DP pulses present bilaterally  Skin: Normal temperature, turgor and texture; no rash Psych: Appropriate affect Neuro: Alert and oriented to person and place, no focal deficit    Vitals:   05/10/21 1033  BP: 126/62  Pulse: (!) 102  Temp: 98.4 F  (36.9 C)  TempSrc: Oral  SpO2: 96%  Weight: 241 lb 12.8 oz (109.7 kg)  Height: 4\' 11"  (1.499 m)    96% on RA BMI Readings from Last 3 Encounters:  05/10/21 48.84 kg/m  03/30/21 48.88 kg/m  02/19/21 47.67 kg/m   Wt Readings from Last 3 Encounters:  05/10/21 241 lb 12.8 oz (109.7 kg)  03/30/21 242 lb (109.8 kg)  02/19/21 236 lb (107 kg)     CBC    Component Value Date/Time   WBC 12.3 (H) 02/13/2021 1545   RBC 4.54 02/13/2021 1545   HGB 12.5 02/13/2021 1545   HCT 37.8 02/13/2021 1545   PLT 347 02/13/2021 1545   MCV 83.3 02/13/2021 1545   MCH 27.5 02/13/2021 1545   MCHC 33.1 02/13/2021  1545   RDW 16.0 (H) 02/13/2021 1545     Chest Imaging: CXR 8/16 with ?RML mucoid impaction  Pulmonary Functions Testing Results: PFT Results Latest Ref Rng & Units 04/10/2021 03/22/2021  FVC-Pre L 1.94 1.96  FVC-Predicted Pre % 79 80  FVC-Post L 1.89 1.96  FVC-Predicted Post % 77 80  Pre FEV1/FVC % % 81 83  Post FEV1/FCV % % 82 84  FEV1-Pre L 1.58 1.62  FEV1-Predicted Pre % 85 88  FEV1-Post L 1.55 1.65  DLCO uncorrected ml/min/mmHg - 15.11  DLCO UNC% % - 92  DLCO corrected ml/min/mmHg - 15.56  DLCO COR %Predicted % - 95  DLVA Predicted % - 123  TLC L - 3.67  TLC % Predicted % - 85  RV % Predicted % - 74   03/22/21: reviewed by me, normal 04/10/21: meth challenge was borderline AHR with drop in FEV1 below 80% from baseline only after 16 mg/mL     Assessment & Plan:   # Cough # Dyspnea on exertion # Intermittent asthma  Could be consistent with mild intermittent asthma, does have borderline AHR on methacholine challenge 04/10/21 despite remaining on singulair. Triggers/exacerbating factors include rhinitis/postnasal drainage, season change (fall especially).  # Daytime sleepiness # History of OSA Thyroid ok per pt. Has a leaky CPAP mask.  Plan: - flonase  - as needed albuterol inhaler or nebulizer for now followed by flutter valve 10 slow but firm puffs twice daily    - ok to continue singulair for now - will send message to pharmacist to discuss which controller inhaler strategy to try which limits risk of worsening glaucoma (whether LABA/ICS, ICS only with either daily use or as-needed symptom-based use) - If worsening DOE especially in absence of accompanying cough, or if refractory to prednisone burst and SABA use then will need to consider pharmacologic stress testing (hip pain limits ability to do exercise testing)    I spent 35 minutes dedicated to the care of this patient on the date of this encounter to include pre-visit review of records, face-to-face time with the patient discussing conditions above, post visit ordering of testing, clinical documentation with the electronic health record, and communicating necessary findings to members of the patients care team.        Omar Person, MD  Pulmonary Critical Care 05/10/2021 11:03 AM

## 2021-05-10 ENCOUNTER — Ambulatory Visit (INDEPENDENT_AMBULATORY_CARE_PROVIDER_SITE_OTHER): Payer: Medicare Other | Admitting: Student

## 2021-05-10 ENCOUNTER — Other Ambulatory Visit: Payer: Self-pay

## 2021-05-10 ENCOUNTER — Encounter: Payer: Self-pay | Admitting: Student

## 2021-05-10 VITALS — BP 126/62 | HR 102 | Temp 98.4°F | Ht 59.0 in | Wt 241.8 lb

## 2021-05-10 DIAGNOSIS — R053 Chronic cough: Secondary | ICD-10-CM | POA: Diagnosis not present

## 2021-05-10 DIAGNOSIS — J4520 Mild intermittent asthma, uncomplicated: Secondary | ICD-10-CM

## 2021-05-10 DIAGNOSIS — R0609 Other forms of dyspnea: Secondary | ICD-10-CM

## 2021-05-10 NOTE — Patient Instructions (Signed)
-   I will send a message to our pharmacist to discuss which inhaler would limit your risk for worsening glaucoma (most appear safe in this regard however) - Let me know if any worsening shortness of breath and cough and we will discuss treating asthma vs obtaining pharmacologic stress test  - Keep using singulair daily, albuterol as needed in the meantime - See you in 3 months

## 2021-05-14 DIAGNOSIS — G4733 Obstructive sleep apnea (adult) (pediatric): Secondary | ICD-10-CM | POA: Diagnosis not present

## 2021-05-22 ENCOUNTER — Telehealth: Payer: Self-pay | Admitting: Student

## 2021-05-22 MED ORDER — BUDESONIDE-FORMOTEROL FUMARATE 80-4.5 MCG/ACT IN AERO: 2.0000 | INHALATION_SPRAY | Freq: Two times a day (BID) | RESPIRATORY_TRACT | 12 refills | Status: DC

## 2021-05-22 NOTE — Telephone Encounter (Signed)
Low overall risk of glaucoma with all inhalers and there does not appear to be increased risk with LABA/ICS compared to ICS. Prescription for symbicort 80 2 puffs BID sent to pharmacy. If too expensive let me know. There is [place] patient assistance program through which symbicort can be provided at no cost if she fills out paperwork online.  Laroy Apple Pulmonary/Critical Care

## 2021-05-22 NOTE — Telephone Encounter (Signed)
Will message to provider.

## 2021-05-23 NOTE — Telephone Encounter (Signed)
Call made to patient, confirmed DOB. Made aware of NM recommendations. Voiced understanding. She will call back if too expensive.   Nothing further needed at this time.

## 2021-06-20 ENCOUNTER — Telehealth: Payer: Self-pay | Admitting: Student

## 2021-06-20 NOTE — Telephone Encounter (Signed)
Pt calling- states she was started on Symbicort by NM when she  saw him 05/10/21. Pt states since she has started it, she has developed a cough,but once she takes Symbicort, the cough goes away. Pt states she did not have the cough prior to taking Symbicort. Pt not sure if it's just her body getting used to the medication or what. Please advise [phone]

## 2021-06-21 MED ORDER — AEROCHAMBER MV MISC: 5 refills | Status: DC

## 2021-06-21 MED ORDER — AEROCHAMBER MV MISC: 0 refills | Status: DC

## 2021-06-21 NOTE — Telephone Encounter (Signed)
Called and spoke with pt about her Symbicort inhaler. Pt said as soon as she uses the Symbicort, she will cough but then the cough will go away. Pt said that she feels like the Symbicort is helping with her symptoms that she discussed with Dr. Thora Lance at last OV but just has a cough when she first uses it.  Stated to pt that we could send in an Rx for a spacer for her to use with her Symbicort to see if it will help her not cough when she first uses her inhaler and pt verbalized understanding and stated she would give that a try. Rx has been sent to pt's preferred pharmacy.nothing further needed.

## 2021-07-10 ENCOUNTER — Other Ambulatory Visit: Payer: Self-pay | Admitting: Internal Medicine

## 2021-07-10 DIAGNOSIS — F3341 Major depressive disorder, recurrent, in partial remission: Secondary | ICD-10-CM | POA: Diagnosis not present

## 2021-07-10 DIAGNOSIS — Z1231 Encounter for screening mammogram for malignant neoplasm of breast: Secondary | ICD-10-CM

## 2021-07-12 NOTE — Telephone Encounter (Signed)
Called and spoke with the pt. She states using symbicort with spacer but still having a lot of cough. OV with Beth for 9:30 am tomorrow for eval. Nothing further needed.

## 2021-07-13 ENCOUNTER — Other Ambulatory Visit: Payer: Self-pay

## 2021-07-13 ENCOUNTER — Ambulatory Visit (INDEPENDENT_AMBULATORY_CARE_PROVIDER_SITE_OTHER): Payer: Medicare Other | Admitting: Primary Care

## 2021-07-13 ENCOUNTER — Encounter: Payer: Self-pay | Admitting: Primary Care

## 2021-07-13 DIAGNOSIS — J4520 Mild intermittent asthma, uncomplicated: Secondary | ICD-10-CM | POA: Insufficient documentation

## 2021-07-13 MED ORDER — FLUTICASONE FUROATE-VILANTEROL 100-25 MCG/ACT IN AEPB: 1.0000 | INHALATION_SPRAY | Freq: Every day | RESPIRATORY_TRACT | 0 refills | Status: DC

## 2021-07-13 NOTE — Patient Instructions (Signed)
Recommendations: Stop Symbicort  Start BREO 100 - take one puff daily in the morning (rinse mouth after use) Call us in 2 weeks to let us know if you want refill of BREO or if you still have cough with inhaler  Follow-up: February as scheduled with Dr. Thora Lance

## 2021-07-13 NOTE — Progress Notes (Signed)
@Patient  ID: , female    DOB: 22-Aug-1950, 71 y.o.   MRN: [id number]  Chief Complaint  Patient presents with   Follow-up    Symbicort reevaluation     Referring provider: [id number], MD  HPI: 71 year old female, never smoked. PMH significant for hx asthma. Patient of Dr. 78, seen for initial consult on 02/19/21 for dyspnea on exertion and cough. Maintained on Singulair 10mg  daily. Ordered for PFTs. Given flutter valve.  If still has cough, consider methacholine challenge and/or CT chest   Previous [facility] pulmonary encounter: 02/19/21 - Dr.  70yF with chart history of adult-onset asthma, DM not requiring insulin, depression/anxiety, glaucoma, OSA on CPAP - actively follows with Dr. 02/21/21 with [facility], had sinus surgery in the remote past for a deviated septum.   Her husband says that she often gets asthma exacerbations as she's getting over a cold, especially if she's not on top of her bronchodilators via nebulizer. When she has an exacerbation she coughs quite a bit, productive. Usually prednisone is helpful but less helpful during this acute illness. Tried azithromycin as well. DOE during an attack to less than a block.   She was then recently at [facility] for asthma exacerbation. She does think the doxycycline may have been helpful. Last dose of doxy and prednisone today. Productive cough is better.    She has some rhinorrhea but no sinonasal congestion, doesn't report frequent trouble with sinonasal congestion. In past she had more trouble with GERD and still takes prilosec periodically for it. Her asthma does flare up in spring/fall typically.      Inhalers tried previously: Albuterol, perhaps breo but she is unsure   Her father had lung cancer. Sister has COPD.   She has always done administrative work, [employer]. Have previously lived in [place], [place], [place], [place]. No pets/birds at home.    03/30/2021 Patient presents today  for 1 month follow-up DOE. She reports mild dyspnea with exertion. Cough has resolved. Pulmonary function testing showed normal lung function. She gets winded walking more than 150-246ft. Symptoms have been present since she had bronchitis in August.She does not notice any wheezing or chest tightness symptoms. CXR on 02/13/21 showed no active cardiopulmonary disease. FENO was normal today.    05/10/21- Dr. 02/15/21  70yF with chart history of adult-onset asthma, DM not requiring insulin, depression/anxiety, glaucoma, OSA on CPAP - actively follows with Dr. 13/10/22 with [facility], had sinus surgery in the remote past for a deviated septum.  Her husband says that she often gets asthma exacerbations as she's getting over a cold, especially if she's not on top of her bronchodilators via nebulizer. When she has an exacerbation she coughs quite a bit, productive. Usually prednisone is helpful but less helpful during this acute illness. Tried azithromycin as well. DOE during an attack to less than a block.  She was then recently at [facility] for asthma exacerbation. She does think the doxycycline may have been helpful. Last dose of doxy and prednisone today. Productive cough is better.   She has some rhinorrhea but no sinonasal congestion, doesn't report frequent trouble with sinonasal congestion. In past she had more trouble with GERD and still takes prilosec periodically for it. Her asthma does flare up in spring/fall typically.    Inhalers tried previously: Albuterol, perhaps breo but she is unsure  Her father had lung cancer. Sister has COPD.  She has always done administrative work, [employer]. Have previously  lived in [place], [place], [place], [place]. No pets/birds at home.     Interval HPI: Seen by Buelah Manis in clinic 9/30. Consideration given to stress testing given more prominent DOE at that time. Had methacholine challenge with borderline AHR 10/11. DOE apparently has  improved somewhat. Was still on montelukast at time of methacholine challenge.  She says she's basically back to baseline with regard to DOE. Hard to quantify DOE due to competing hip pain. Cough is better. No fever, CP, orthopnea.   Seems like major trigger is transition to fall season.   Otherwise pertinent review of systems is negative.   07/13/2021- Interim hx  Patient presents today for acute visit/cough. During her last visit with Dr. Thora Lance in November she was started on Symbicort .  She feels her lung capacity is greater since starting Symbicort but experiences a cough after taking medication. Cough is occasionally productive with clear mucus. She needs to use albuterol inhaler after using Symbicort to calm her cough down. She is using spacer. She has no other acute complaints or respiratory symptoms. No sick contacts. Afebrile.   Pulmonary function testing: 03/22/21- FVC 1.96 (80%), FEV1 1.65 (89%), ratio 84, TLC 85%, DLCOcor 15.96 (95%) Normal pulmonary function   03/30/2021 FENO 19   Allergies  Allergen Reactions   Bactrim [Sulfamethoxazole-Trimethoprim] Rash    Chest tightness   Glimepiride Rash   Jardiance [Empagliflozin] Other (See Comments)   Metformin And Related Diarrhea    Immunization History  Administered Date(s) Administered   Influenza Split 04/22/2017, 03/29/2018, 04/03/2020   Influenza, High Dose Seasonal PF 03/11/2019   PFIZER(Purple Top)SARS-COV-2 Vaccination 07/08/2019, 07/26/2019, 05/12/2020   Pneumococcal Conjugate-13 05/08/2017   Pneumococcal Polysaccharide-23 07/06/2018   Td 05/08/2017   Zoster Recombinat (Shingrix) 04/07/2019   Zoster, Live 04/07/2019, 11/23/2019    Past Medical History:  Diagnosis Date   Asthma    Diabetes mellitus without complication (HCC)     Tobacco History: Social History   Tobacco Use  Smoking Status Never  Smokeless Tobacco Never   Counseling given: Not Answered   Outpatient Medications Prior to Visit   Medication Sig Dispense Refill   albuterol (PROVENTIL HFA;VENTOLIN HFA) 108 (90 Base) MCG/ACT inhaler Inhale 1-2 puffs into the lungs every 6 (six) hours as needed for wheezing or shortness of breath.     albuterol (PROVENTIL) (2.5 MG/3ML) 0.083% nebulizer solution Take 2.5 mg by nebulization every 6 (six) hours as needed for wheezing or shortness of breath.     atorvastatin (LIPITOR) 10 MG tablet Take 10 mg by mouth daily.  3   bimatoprost (LUMIGAN) 0.01 % SOLN 1 drop at bedtime.     brimonidine-timolol (COMBIGAN) 0.2-0.5 % ophthalmic solution Place 1 drop into both eyes every 12 (twelve) hours.     Dulaglutide 0.75 MG/0.5ML SOPN Inject into the skin.     levothyroxine (SYNTHROID, LEVOTHROID) 50 MCG tablet Take 50 mcg by mouth daily before breakfast.     MELATONIN PO Take 1 tablet by mouth at bedtime as needed (sleep).     montelukast (SINGULAIR) 10 MG tablet Take 10 mg by mouth at bedtime.     multivitamin-lutein (OCUVITE-LUTEIN) CAPS capsule Take 1 capsule by mouth daily.     PRESCRIPTION MEDICATION 1 each daily.     Spacer/Aero-Holding Chambers (AEROCHAMBER MV) inhaler Use as instructed 1 each 5   traZODone (DESYREL) 150 MG tablet Take 150 mg by mouth at bedtime.     venlafaxine XR (EFFEXOR-XR) 150 MG 24 hr capsule Take 150 mg by  mouth 2 (two) times daily.     budesonide-formoterol (SYMBICORT) 80-4.5 MCG/ACT inhaler Inhale 2 puffs into the lungs in the morning and at bedtime. 1 each 12   No facility-administered medications prior to visit.      Review of Systems  Review of Systems  Constitutional: Negative.   HENT: Negative.    Respiratory:  Positive for cough. Negative for chest tightness, shortness of breath and wheezing.     Physical Exam  BP 122/70 (BP Location: Left Arm, Cuff Size: Normal)    Pulse 96    Temp 98.2 F (36.8 C) (Oral)    Ht 4\' 11"  (1.499 m)    Wt 242 lb 3.2 oz (109.9 kg)    SpO2 97%    BMI 48.92 kg/m  Physical Exam Constitutional:      Appearance: Normal  appearance.  HENT:     Head: Normocephalic and atraumatic.     Right Ear: Tympanic membrane normal.     Left Ear: Tympanic membrane normal.     Mouth/Throat:     Mouth: Mucous membranes are moist.     Pharynx: Oropharynx is clear.  Cardiovascular:     Rate and Rhythm: Normal rate and regular rhythm.  Pulmonary:     Effort: Pulmonary effort is normal.     Breath sounds: Normal breath sounds.     Comments: CTA  Musculoskeletal:        General: Normal range of motion.  Skin:    General: Skin is warm and dry.  Neurological:     General: No focal deficit present.     Mental Status: She is alert and oriented to person, place, and time. Mental status is at baseline.  Psychiatric:        Mood and Affect: Mood normal.        Behavior: Behavior normal.        Thought Content: Thought content normal.        Judgment: Judgment normal.     Lab Results:  CBC    Component Value Date/Time   WBC 12.3 (H) 02/13/2021 1545   RBC 4.54 02/13/2021 1545   HGB 12.5 02/13/2021 1545   HCT 37.8 02/13/2021 1545   PLT 347 02/13/2021 1545   MCV 83.3 02/13/2021 1545   MCH 27.5 02/13/2021 1545   MCHC 33.1 02/13/2021 1545   RDW 16.0 (H) 02/13/2021 1545    BMET    Component Value Date/Time   NA 132 (L) 02/13/2021 1545   K 3.8 02/13/2021 1545   CL 95 (L) 02/13/2021 1545   CO2 25 02/13/2021 1545   GLUCOSE 107 (H) 02/13/2021 1545   BUN 7 (L) 02/13/2021 1545   CREATININE 0.62 02/13/2021 1545   CALCIUM 9.5 02/13/2021 1545   GFRNONAA >60 02/13/2021 1545   GFRAA >60 12/03/2017 2302    BNP No results found for: BNP  ProBNP No results found for: PROBNP  Imaging: No results found.   Assessment & Plan:   Intermittent asthma - Patient reports improvement in lung capacity since starting Symbicort 80mcg 2 puffs BID, however, she is experiencing coughing fits after taking medication. She has no infectious symptoms. Recommend changing low dose ICS/LABA to trial Breo 100mcg daily. If cough  continues can either switch to nebulized medication or try Spiriva Respimat 1.7025mcg. She has a follow-up in February with Dr. Thora LanceMeier.      Glenford BayleyElizabeth W Destony Prevost, NP 07/13/2021

## 2021-07-13 NOTE — Assessment & Plan Note (Addendum)
-   Patient reports improvement in lung capacity since starting Symbicort 62mcg 2 puffs BID, however, she is experiencing coughing fits after taking medication. She has no infectious symptoms. Recommend changing low dose ICS/LABA to trial Breo 153mcg daily. If cough continues can either switch to nebulized medication or try Spiriva Respimat 1.57mcg. She has a follow-up in February with Dr. Verlee Monte.

## 2021-07-24 DIAGNOSIS — E7800 Pure hypercholesterolemia, unspecified: Secondary | ICD-10-CM | POA: Diagnosis not present

## 2021-07-24 DIAGNOSIS — G4733 Obstructive sleep apnea (adult) (pediatric): Secondary | ICD-10-CM | POA: Diagnosis not present

## 2021-07-24 DIAGNOSIS — J4530 Mild persistent asthma, uncomplicated: Secondary | ICD-10-CM | POA: Diagnosis not present

## 2021-07-24 DIAGNOSIS — I700 Atherosclerosis of aorta: Secondary | ICD-10-CM | POA: Diagnosis not present

## 2021-07-24 DIAGNOSIS — E1169 Type 2 diabetes mellitus with other specified complication: Secondary | ICD-10-CM | POA: Diagnosis not present

## 2021-07-24 DIAGNOSIS — F331 Major depressive disorder, recurrent, moderate: Secondary | ICD-10-CM | POA: Diagnosis not present

## 2021-07-24 DIAGNOSIS — E039 Hypothyroidism, unspecified: Secondary | ICD-10-CM | POA: Diagnosis not present

## 2021-07-24 DIAGNOSIS — Z6841 Body Mass Index (BMI) 40.0 and over, adult: Secondary | ICD-10-CM | POA: Diagnosis not present

## 2021-07-25 ENCOUNTER — Telehealth: Payer: Self-pay | Admitting: Student

## 2021-07-25 MED ORDER — FLUTICASONE FUROATE-VILANTEROL 100-25 MCG/ACT IN AEPB: 1.0000 | INHALATION_SPRAY | Freq: Every day | RESPIRATORY_TRACT | 5 refills | Status: DC

## 2021-07-25 NOTE — Telephone Encounter (Signed)
Rx for breo has been sent to preferred pharmacy for pt. Attempted to call pt to let her know this had been done but unable to reach. Left detailed message letting her know this had been done. Nothing further needed.

## 2021-08-13 NOTE — Progress Notes (Signed)
Synopsis: Referred for asthma/copd by Georgann Housekeeper, MD  Subjective:   PATIENT ID: Kayla Ray GENDER: female DOB: [birth year], MRN: [id number]  Chief Complaint  Patient presents with   Follow-up    Breathing is overall doing well. She has not needed albuterol inhaler or neb.     70yF with chart history of adult-onset asthma, DM not requiring insulin, depression/anxiety, glaucoma, OSA on CPAP - actively follows with Dr. Earl Gala with [facility], had sinus surgery in the remote past for a deviated septum.  Her husband says that she often gets asthma exacerbations as she's getting over a cold, especially if she's not on top of her bronchodilators via nebulizer. When she has an exacerbation she coughs quite a bit, productive. Usually prednisone is helpful but less helpful during this acute illness. Tried azithromycin as well. DOE during an attack to less than a block.  She was then recently at [facility] for asthma exacerbation. She does think the doxycycline may have been helpful. Last dose of doxy and prednisone today. Productive cough is better.   She has some rhinorrhea but no sinonasal congestion, doesn't report frequent trouble with sinonasal congestion. In past she had more trouble with GERD and still takes prilosec periodically for it. Her asthma does flare up in spring/fall typically.    Inhalers tried previously: Albuterol, perhaps breo but she is unsure  Her father had lung cancer. Sister has COPD.  She has always done administrative work, [employer]. Have previously lived in [place], [place], [place], [place]. No pets/birds at home.     Interval HPI: At last visit with Buelah Manis she was transitioned to [facility] in setting cough that she'd developed on Symbicort 80 2 puffs BID with spacer.   No new imaging or PFTs.  She feels better on breo 1 puff once daily. Rinsing mouth afterward. She has no cough.    Otherwise pertinent review of systems is  negative.  Past Medical History:  Diagnosis Date   Asthma    Diabetes mellitus without complication (HCC)      Family History  Problem Relation Age of Onset   Diabetes Mother    Heart disease Mother    Cancer Sister        lymphoma   Heart disease Sister    Diabetes Maternal Grandmother      Past Surgical History:  Procedure Laterality Date   ABDOMINAL HYSTERECTOMY     vaginal hysterectomy with ovarian preservation    BREAST BIOPSY Right 2019   CATARACT EXTRACTION     CESAREAN SECTION     x2   GALLBLADDER SURGERY      Social History   Socioeconomic History   Marital status: Married    Spouse name: Not on file   Number of children: Not on file   Years of education: Not on file   Highest education level: Not on file  Occupational History   Not on file  Tobacco Use   Smoking status: Never   Smokeless tobacco: Never  Vaping Use   Vaping Use: Never used  Substance and Sexual Activity   Alcohol use: Yes    Comment: OCC   Drug use: Never   Sexual activity: Not Currently    Partners: Male    Comment: 1st intercourse- 76, married- 46 yrs   Other Topics Concern   Not on file  Social History Narrative   Not on file   Social Determinants of Health   Financial Resource Strain: Not on file  Food Insecurity: Not on file  Transportation Needs: Not on file  Physical Activity: Not on file  Stress: Not on file  Social Connections: Not on file  Intimate Partner Violence: Not on file     Allergies  Allergen Reactions   Bactrim [Sulfamethoxazole-Trimethoprim] Rash    Chest tightness   Glimepiride Rash   Jardiance [Empagliflozin] Other (See Comments)   Metformin And Related Diarrhea     Outpatient Medications Prior to Visit  Medication Sig Dispense Refill   albuterol (PROVENTIL HFA;VENTOLIN HFA) 108 (90 Base) MCG/ACT inhaler Inhale 1-2 puffs into the lungs every 6 (six) hours as needed for wheezing or shortness of breath.     albuterol (PROVENTIL) (2.5 MG/3ML)  0.083% nebulizer solution Take 2.5 mg by nebulization every 6 (six) hours as needed for wheezing or shortness of breath.     atorvastatin (LIPITOR) 10 MG tablet Take 10 mg by mouth daily.  3   bimatoprost (LUMIGAN) 0.01 % SOLN 1 drop at bedtime.     brimonidine-timolol (COMBIGAN) 0.2-0.5 % ophthalmic solution Place 1 drop into both eyes every 12 (twelve) hours.     Dulaglutide 0.75 MG/0.5ML SOPN Inject into the skin.     levothyroxine (SYNTHROID, LEVOTHROID) 50 MCG tablet Take 50 mcg by mouth daily before breakfast.     MELATONIN PO Take 1 tablet by mouth at bedtime as needed (sleep).     multivitamin-lutein (OCUVITE-LUTEIN) CAPS capsule Take 1 capsule by mouth daily.     PRESCRIPTION MEDICATION 1 each daily.     Spacer/Aero-Holding Chambers (AEROCHAMBER MV) inhaler Use as instructed 1 each 5   traZODone (DESYREL) 150 MG tablet Take 150 mg by mouth at bedtime.     venlafaxine XR (EFFEXOR-XR) 150 MG 24 hr capsule Take 150 mg by mouth 2 (two) times daily.     fluticasone furoate-vilanterol (BREO ELLIPTA) 100-25 MCG/ACT AEPB Inhale 1 puff into the lungs daily. 60 each 5   montelukast (SINGULAIR) 10 MG tablet Take 10 mg by mouth at bedtime.     No facility-administered medications prior to visit.       Objective:   Physical Exam:  General appearance: 71 y.o., female, NAD, conversant  Eyes: anicteric sclerae; PERRL, tracking appropriately HENT: NCAT; MMM Neck: Trachea midline; no lymphadenopathy, no JVD Lungs: CTAB, no crackles, no wheeze, with normal respiratory effort CV: RRR, no murmur  Abdomen: Soft, non-tender; non-distended, BS present  Extremities: No peripheral edema, warm Skin: Normal turgor and texture; no rash Psych: Appropriate affect Neuro: Alert and oriented to person and place, no focal deficit     Vitals:   08/14/21 1110  BP: 130/68  Pulse: (!) 101  Temp: 98.6 F (37 C)  TempSrc: Oral  SpO2: 95%  Weight: 240 lb (108.9 kg)  Height: 4\' 11"  (1.499 m)      95% on RA BMI Readings from Last 3 Encounters:  08/14/21 48.47 kg/m  07/13/21 48.92 kg/m  05/10/21 48.84 kg/m   Wt Readings from Last 3 Encounters:  08/14/21 240 lb (108.9 kg)  07/13/21 242 lb 3.2 oz (109.9 kg)  05/10/21 241 lb 12.8 oz (109.7 kg)     CBC    Component Value Date/Time   WBC 12.3 (H) 02/13/2021 1545   RBC 4.54 02/13/2021 1545   HGB 12.5 02/13/2021 1545   HCT 37.8 02/13/2021 1545   PLT 347 02/13/2021 1545   MCV 83.3 02/13/2021 1545   MCH 27.5 02/13/2021 1545   MCHC 33.1 02/13/2021 1545   RDW 16.0 (H) 02/13/2021  1545     Chest Imaging: CXR 8/16 with ?RML mucoid impaction  Pulmonary Functions Testing Results: PFT Results Latest Ref Rng & Units 04/10/2021 03/22/2021  FVC-Pre L 1.94 1.96  FVC-Predicted Pre % 79 80  FVC-Post L 1.89 1.96  FVC-Predicted Post % 77 80  Pre FEV1/FVC % % 81 83  Post FEV1/FCV % % 82 84  FEV1-Pre L 1.58 1.62  FEV1-Predicted Pre % 85 88  FEV1-Post L 1.55 1.65  DLCO uncorrected ml/min/mmHg - 15.11  DLCO UNC% % - 92  DLCO corrected ml/min/mmHg - 15.56  DLCO COR %Predicted % - 95  DLVA Predicted % - 123  TLC L - 3.67  TLC % Predicted % - 85  RV % Predicted % - 74   03/22/21: reviewed by me, normal 04/10/21: meth challenge was borderline AHR with drop in FEV1 below 80% from baseline only after 16 mg/mL     Assessment & Plan:   # Cough # Dyspnea on exertion # Intermittent asthma  Could be consistent with mild intermittent asthma, does have borderline AHR on methacholine challenge 04/10/21 despite remaining on singulair. Triggers/exacerbating factors include rhinitis/postnasal drainage, season change (fall especially). Has now had a few months with reasonably good control on LABA/ICS, does prefer breo.  # Daytime sleepiness # History of OSA Thyroid ok per pt. Has a leaky CPAP mask.  Plan: - flonase  - can breo on as needed basis.  If you know you're gonna be outside a lot during difficult seasons like fall, spring  then use your breo 1 puff 30 minutes before you go out. If breathing substantially worse then go back to using it 1 puff once daily, can even increase to 2 puffs daily for a few days but send me a message if you have to do this - as needed albuterol inhaler or nebulizer for now followed by flutter valve 10 slow but firm puffs twice daily   - ok to continue singulair for now       Omar Person, MD [facility] 08/14/2021 12:40 PM

## 2021-08-14 ENCOUNTER — Other Ambulatory Visit: Payer: Self-pay

## 2021-08-14 ENCOUNTER — Encounter: Payer: Self-pay | Admitting: Student

## 2021-08-14 ENCOUNTER — Ambulatory Visit (INDEPENDENT_AMBULATORY_CARE_PROVIDER_SITE_OTHER): Payer: Medicare Other | Admitting: Student

## 2021-08-14 VITALS — BP 130/68 | HR 101 | Temp 98.6°F | Ht 59.0 in | Wt 240.0 lb

## 2021-08-14 DIAGNOSIS — J4520 Mild intermittent asthma, uncomplicated: Secondary | ICD-10-CM | POA: Diagnosis not present

## 2021-08-14 MED ORDER — MONTELUKAST SODIUM 10 MG PO TABS: 10.0000 mg | ORAL_TABLET | Freq: Every day | ORAL | 3 refills | Status: DC

## 2021-08-14 MED ORDER — FLUTICASONE FUROATE-VILANTEROL 100-25 MCG/ACT IN AEPB: 1.0000 | INHALATION_SPRAY | Freq: Every day | RESPIRATORY_TRACT | 11 refills | Status: DC

## 2021-08-14 NOTE — Patient Instructions (Signed)
-   Can use breo on as needed basis.  If you know you're gonna be outside a lot during difficult seasons like fall, spring then use your breo 1 puff 30 minutes before you go out. If breathing substantially worse then go back to using it 1 puff once daily, can even increase to 2 puffs daily for a few days but send me a message if you have to do this - keep using singulair 10 mg daily

## 2021-08-15 DIAGNOSIS — H401131 Primary open-angle glaucoma, bilateral, mild stage: Secondary | ICD-10-CM | POA: Diagnosis not present

## 2021-08-21 ENCOUNTER — Ambulatory Visit
Admission: RE | Admit: 2021-08-21 | Discharge: 2021-08-21 | Disposition: A | Discharge status: Home / Self Care | Payer: Medicare Other | Source: Ambulatory Visit | Attending: Internal Medicine | Admitting: Internal Medicine

## 2021-08-21 ENCOUNTER — Other Ambulatory Visit: Payer: Self-pay

## 2021-08-21 DIAGNOSIS — Z1231 Encounter for screening mammogram for malignant neoplasm of breast: Secondary | ICD-10-CM | POA: Diagnosis not present

## 2021-08-31 DIAGNOSIS — Z20822 Contact with and (suspected) exposure to covid-19: Secondary | ICD-10-CM | POA: Diagnosis not present

## 2021-09-05 DIAGNOSIS — F3341 Major depressive disorder, recurrent, in partial remission: Secondary | ICD-10-CM | POA: Diagnosis not present

## 2021-10-05 DIAGNOSIS — Z20822 Contact with and (suspected) exposure to covid-19: Secondary | ICD-10-CM | POA: Diagnosis not present

## 2021-10-25 ENCOUNTER — Encounter (HOSPITAL_BASED_OUTPATIENT_CLINIC_OR_DEPARTMENT_OTHER): Payer: Self-pay | Admitting: Obstetrics & Gynecology

## 2021-10-25 ENCOUNTER — Ambulatory Visit (INDEPENDENT_AMBULATORY_CARE_PROVIDER_SITE_OTHER): Payer: Medicare Other | Admitting: Obstetrics & Gynecology

## 2021-10-25 VITALS — BP 155/77 | HR 100 | Ht 59.0 in | Wt 240.6 lb

## 2021-10-25 DIAGNOSIS — Z780 Asymptomatic menopausal state: Secondary | ICD-10-CM | POA: Diagnosis not present

## 2021-10-25 DIAGNOSIS — Z90710 Acquired absence of both cervix and uterus: Secondary | ICD-10-CM | POA: Diagnosis not present

## 2021-10-25 DIAGNOSIS — Z79890 Hormone replacement therapy (postmenopausal): Secondary | ICD-10-CM | POA: Diagnosis not present

## 2021-10-25 DIAGNOSIS — E782 Mixed hyperlipidemia: Secondary | ICD-10-CM | POA: Insufficient documentation

## 2021-10-25 DIAGNOSIS — Z9229 Personal history of other drug therapy: Secondary | ICD-10-CM | POA: Diagnosis not present

## 2021-10-25 DIAGNOSIS — G4733 Obstructive sleep apnea (adult) (pediatric): Secondary | ICD-10-CM | POA: Insufficient documentation

## 2021-10-25 DIAGNOSIS — H409 Unspecified glaucoma: Secondary | ICD-10-CM | POA: Insufficient documentation

## 2021-10-25 DIAGNOSIS — E039 Hypothyroidism, unspecified: Secondary | ICD-10-CM | POA: Insufficient documentation

## 2021-10-25 DIAGNOSIS — I700 Atherosclerosis of aorta: Secondary | ICD-10-CM | POA: Insufficient documentation

## 2021-10-25 DIAGNOSIS — F325 Major depressive disorder, single episode, in full remission: Secondary | ICD-10-CM | POA: Insufficient documentation

## 2021-10-25 DIAGNOSIS — E1165 Type 2 diabetes mellitus with hyperglycemia: Secondary | ICD-10-CM | POA: Insufficient documentation

## 2021-10-25 DIAGNOSIS — Z6841 Body Mass Index (BMI) 40.0 and over, adult: Secondary | ICD-10-CM | POA: Insufficient documentation

## 2021-10-25 DIAGNOSIS — G4700 Insomnia, unspecified: Secondary | ICD-10-CM | POA: Insufficient documentation

## 2021-10-25 NOTE — Progress Notes (Signed)
71 y.o. G2P2 Married White or Caucasian female here for new patient exam.  Pt moved from [place] June, 2018.  She is on compounded HRT that she gets from a provider in [place].  She goes there once a year for this.  Is on a pill and cream for her hormonal therapy but is not completely sure what it is but will get information to me so I can see if this is something I can help her get locally.  H/o TVH due to fibroids.  Ovaries removed.  Denies vaginal bleeding. ? ?No LMP recorded. Patient is postmenopausal.          ?Sexually active: No.  Husband with erectile dysfunction ?H/O STD:  no ? ?Health Maintenance: ?PCP:  Dr. Donette Larry, [facility] at [place].  Blood work done with him ?Vaccines are up to date:  Pt feels up to date but I do not have records ?Colonoscopy:  did with [facility] ?MMG:  08/21/2021 Negative ?BMD:  04/13/2018, normal ?Last pap smear:  03/09/2018 Negative.   ?H/o abnormal pap smear:  no ? ? ? reports that she has never smoked. She has never used smokeless tobacco. She reports current alcohol use. She reports that she does not use drugs. ? ?Past Medical History:  ?Diagnosis Date  ? Asthma   ? Diabetes mellitus without complication (HCC)   ? ? ?Past Surgical History:  ?Procedure Laterality Date  ? BREAST BIOPSY Right 2019  ? CATARACT EXTRACTION    ? CESAREAN SECTION    ? x2  ? GALLBLADDER SURGERY    ? VAGINAL HYSTERECTOMY  1984  ? ? ?Current Outpatient Medications  ?Medication Sig Dispense Refill  ? albuterol (PROVENTIL HFA;VENTOLIN HFA) 108 (90 Base) MCG/ACT inhaler Inhale 1-2 puffs into the lungs every 6 (six) hours as needed for wheezing or shortness of breath.    ? albuterol (PROVENTIL) (2.5 MG/3ML) 0.083% nebulizer solution Take 2.5 mg by nebulization every 6 (six) hours as needed for wheezing or shortness of breath.    ? atorvastatin (LIPITOR) 10 MG tablet Take 10 mg by mouth daily.  3  ? brimonidine-timolol (COMBIGAN) 0.2-0.5 % ophthalmic solution Place 1 drop into both eyes every 12 (twelve) hours.    ?  Dulaglutide 0.75 MG/0.5ML SOPN Inject into the skin.    ? fluticasone furoate-vilanterol (BREO ELLIPTA) 100-25 MCG/ACT AEPB Inhale 1 puff into the lungs daily. 60 each 11  ? levothyroxine (SYNTHROID, LEVOTHROID) 50 MCG tablet Take 50 mcg by mouth daily before breakfast.    ? montelukast (SINGULAIR) 10 MG tablet Take 1 tablet (10 mg total) by mouth at bedtime. 90 tablet 3  ? multivitamin-lutein (OCUVITE-LUTEIN) CAPS capsule Take 1 capsule by mouth daily.    ? PRESCRIPTION MEDICATION 1 each daily.    ? Spacer/Aero-Holding Chambers (AEROCHAMBER MV) inhaler Use as instructed 1 each 5  ? traZODone (DESYREL) 150 MG tablet Take 150 mg by mouth at bedtime.    ? venlafaxine XR (EFFEXOR-XR) 150 MG 24 hr capsule Take 150 mg by mouth 2 (two) times daily.    ? bimatoprost (LUMIGAN) 0.01 % SOLN 1 drop at bedtime. (Patient not taking: Reported on 10/25/2021)    ? MELATONIN PO Take 1 tablet by mouth at bedtime as needed (sleep). (Patient not taking: Reported on 10/25/2021)    ? ?No current facility-administered medications for this visit.  ? ? ?Family History  ?Problem Relation Age of Onset  ? Diabetes Mother   ? Heart disease Mother   ? Cancer Sister   ?  lymphoma  ? Heart disease Sister   ? Diabetes Maternal Grandmother   ? ? ?Review of Systems  ?Constitutional: Negative.   ?Genitourinary: Negative.   ? ?Exam:   ?BP (!) 155/77 (BP Location: Right Arm, Patient Position: Sitting, Cuff Size: Large)   Pulse 100   Ht 4\' 11"  (1.499 m) Comment: Reported  Wt 240 lb 9.6 oz (109.1 kg)   BMI 48.60 kg/m?   Height: 4\' 11"  (149.9 cm) (Reported) ? ?General appearance: alert, cooperative and appears stated age ?Breasts: normal appearance, no masses or tenderness ?Abdomen: soft, non-tender; bowel sounds normal; no masses,  no organomegaly ?Lymph nodes: Cervical, supraclavicular, and axillary nodes normal.  No abnormal inguinal nodes palpated ?Neurologic: Grossly normal ? ?Pelvic: External genitalia:  no lesions ?             Urethra:  normal  appearing urethra with no masses, tenderness or lesions ?             Bartholins and Skenes: normal    ?             Vagina: normal appearing vagina with atrophic changes and no discharge, no lesions ?             Cervix: absent ?             Pap taken: No. ?Bimanual Exam:  Uterus:  uterus absent ?             Adnexa: no mass, fullness, tenderness ?              Rectovaginal: Confirms ?              Anus:  normal sphincter tone, no lesions ? ?Chaperone, , CMA, was present for exam. ? ?Assessment/Plan: ?1. Postmenopausal ?- needs BMD in 2024 ?- release or records for colonoscopy and lab work will be signed today ? ?2. History of postmenopausal HRT ?- pt is going to get me exact information about HRT and I will see if can help her have this done locally ? ?3. History of hysterectomy ? ?4  Left shoulder pain ? ?Total time with pt including documentation: 31 minutes ? ? ?

## 2021-10-26 DIAGNOSIS — Z90710 Acquired absence of both cervix and uterus: Secondary | ICD-10-CM | POA: Insufficient documentation

## 2021-10-26 DIAGNOSIS — Z9229 Personal history of other drug therapy: Secondary | ICD-10-CM | POA: Insufficient documentation

## 2021-11-01 ENCOUNTER — Telehealth (HOSPITAL_BASED_OUTPATIENT_CLINIC_OR_DEPARTMENT_OTHER): Payer: Self-pay | Admitting: Obstetrics & Gynecology

## 2021-11-01 ENCOUNTER — Encounter (HOSPITAL_BASED_OUTPATIENT_CLINIC_OR_DEPARTMENT_OTHER): Payer: Self-pay

## 2021-11-01 NOTE — Telephone Encounter (Signed)
Patient  call requests the information about the compounding  pharmacy to be resent .I resent it to her mychart. ?

## 2021-12-04 NOTE — Progress Notes (Unsigned)
Synopsis: Referred for asthma/copd by Georgann Housekeeper, MD  Subjective:   PATIENT ID: Kayla Ray GENDER: female DOB: Oct 20, 1950, MRN: [id number]  No chief complaint on file.   70yF with chart history of adult-onset asthma, DM not requiring insulin, depression/anxiety, glaucoma, OSA on CPAP - actively follows with Dr. Earl Gala with [facility], had sinus surgery in the remote past for a deviated septum.  Her husband says that she often gets asthma exacerbations as she's getting over a cold, especially if she's not on top of her bronchodilators via nebulizer. When she has an exacerbation she coughs quite a bit, productive. Usually prednisone is helpful but less helpful during this acute illness. Tried azithromycin as well. DOE during an attack to less than a block.  She was then recently at [facility] for asthma exacerbation. She does think the doxycycline may have been helpful. Last dose of doxy and prednisone today. Productive cough is better.   She has some rhinorrhea but no sinonasal congestion, doesn't report frequent trouble with sinonasal congestion. In past she had more trouble with GERD and still takes prilosec periodically for it. Her asthma does flare up in spring/fall typically.    Inhalers tried previously: Albuterol, perhaps breo but she is unsure  Her father had lung cancer. Sister has COPD.  She has always done administrative work, Hotel manager. Have previously lived in [place], [place], [place], [place]. No pets/birds at home.     Interval HPI: Last seen 2/14 continuing breo on as needed basis, flonase, SABA/flutter valve   Otherwise pertinent review of systems is negative.  Past Medical History:  Diagnosis Date   Asthma    Diabetes mellitus without complication (HCC)      Family History  Problem Relation Age of Onset   Diabetes Mother    Heart disease Mother    Cancer Sister        lymphoma   Heart disease Sister    Diabetes Maternal  Grandmother      Past Surgical History:  Procedure Laterality Date   BREAST BIOPSY Right 2019   CATARACT EXTRACTION     CESAREAN SECTION     x2   GALLBLADDER SURGERY     VAGINAL HYSTERECTOMY  1984    Social History   Socioeconomic History   Marital status: Married    Spouse name: Not on file   Number of children: Not on file   Years of education: Not on file   Highest education level: Not on file  Occupational History   Not on file  Tobacco Use   Smoking status: Never   Smokeless tobacco: Never  Vaping Use   Vaping Use: Never used  Substance and Sexual Activity   Alcohol use: Yes    Comment: OCC   Drug use: Never   Sexual activity: Not Currently    Partners: Male    Comment: 1st intercourse- 25, married- 45 yrs   Other Topics Concern   Not on file  Social History Narrative   Not on file   Social Determinants of Health   Financial Resource Strain: Not on file  Food Insecurity: Not on file  Transportation Needs: Not on file  Physical Activity: Not on file  Stress: Not on file  Social Connections: Not on file  Intimate Partner Violence: Not on file     Allergies  Allergen Reactions   Bactrim [Sulfamethoxazole-Trimethoprim] Rash    Chest tightness   Glimepiride Rash   Jardiance [Empagliflozin] Other (See Comments)   Metformin  And Related Diarrhea     Outpatient Medications Prior to Visit  Medication Sig Dispense Refill   albuterol (PROVENTIL HFA;VENTOLIN HFA) 108 (90 Base) MCG/ACT inhaler Inhale 1-2 puffs into the lungs every 6 (six) hours as needed for wheezing or shortness of breath.     albuterol (PROVENTIL) (2.5 MG/3ML) 0.083% nebulizer solution Take 2.5 mg by nebulization every 6 (six) hours as needed for wheezing or shortness of breath.     atorvastatin (LIPITOR) 10 MG tablet Take 10 mg by mouth daily.  3   bimatoprost (LUMIGAN) 0.01 % SOLN 1 drop at bedtime. (Patient not taking: Reported on 10/25/2021)     brimonidine-timolol (COMBIGAN) 0.2-0.5 %  ophthalmic solution Place 1 drop into both eyes every 12 (twelve) hours.     Dulaglutide 0.75 MG/0.5ML SOPN Inject into the skin.     fluticasone furoate-vilanterol (BREO ELLIPTA) 100-25 MCG/ACT AEPB Inhale 1 puff into the lungs daily. 60 each 11   levothyroxine (SYNTHROID, LEVOTHROID) 50 MCG tablet Take 50 mcg by mouth daily before breakfast.     MELATONIN PO Take 1 tablet by mouth at bedtime as needed (sleep). (Patient not taking: Reported on 10/25/2021)     montelukast (SINGULAIR) 10 MG tablet Take 1 tablet (10 mg total) by mouth at bedtime. 90 tablet 3   multivitamin-lutein (OCUVITE-LUTEIN) CAPS capsule Take 1 capsule by mouth daily.     PRESCRIPTION MEDICATION 1 each daily.     Spacer/Aero-Holding Chambers (AEROCHAMBER MV) inhaler Use as instructed 1 each 5   traZODone (DESYREL) 150 MG tablet Take 150 mg by mouth at bedtime.     venlafaxine XR (EFFEXOR-XR) 150 MG 24 hr capsule Take 150 mg by mouth 2 (two) times daily.     No facility-administered medications prior to visit.       Objective:   Physical Exam:  General appearance: 71 y.o., female, NAD, conversant  Eyes: anicteric sclerae; PERRL, tracking appropriately HENT: NCAT; MMM Neck: Trachea midline; no lymphadenopathy, no JVD Lungs: CTAB, no crackles, no wheeze, with normal respiratory effort CV: RRR, no murmur  Abdomen: Soft, non-tender; non-distended, BS present  Extremities: No peripheral edema, warm Skin: Normal turgor and texture; no rash Psych: Appropriate affect Neuro: Alert and oriented to person and place, no focal deficit     There were no vitals filed for this visit.      on RA BMI Readings from Last 3 Encounters:  10/25/21 48.60 kg/m  08/14/21 48.47 kg/m  07/13/21 48.92 kg/m   Wt Readings from Last 3 Encounters:  10/25/21 240 lb 9.6 oz (109.1 kg)  08/14/21 240 lb (108.9 kg)  07/13/21 242 lb 3.2 oz (109.9 kg)     CBC    Component Value Date/Time   WBC 12.3 (H) 02/13/2021 1545   RBC 4.54  02/13/2021 1545   HGB 12.5 02/13/2021 1545   HCT 37.8 02/13/2021 1545   PLT 347 02/13/2021 1545   MCV 83.3 02/13/2021 1545   MCH 27.5 02/13/2021 1545   MCHC 33.1 02/13/2021 1545   RDW 16.0 (H) 02/13/2021 1545     Chest Imaging: CXR 8/16 with ?RML mucoid impaction  Pulmonary Functions Testing Results:    Latest Ref Rng & Units 04/10/2021   12:37 PM 03/22/2021   10:45 AM  PFT Results  FVC-Pre L 1.94   1.96    FVC-Predicted Pre % 79   80    FVC-Post L 1.89   1.96    FVC-Predicted Post % 77   80    Pre  FEV1/FVC % % 81   83    Post FEV1/FCV % % 82   84    FEV1-Pre L 1.58   1.62    FEV1-Predicted Pre % 85   88    FEV1-Post L 1.55   1.65    DLCO uncorrected ml/min/mmHg  15.11    DLCO UNC% %  92    DLCO corrected ml/min/mmHg  15.56    DLCO COR %Predicted %  95    DLVA Predicted %  123    TLC L  3.67    TLC % Predicted %  85    RV % Predicted %  74     03/22/21: reviewed by me, normal 04/10/21: meth challenge was borderline AHR with drop in FEV1 below 80% from baseline only after 16 mg/mL     Assessment & Plan:   # Cough # Dyspnea on exertion # Intermittent asthma  Could be consistent with mild intermittent asthma, does have borderline AHR on methacholine challenge 04/10/21 despite remaining on singulair. Triggers/exacerbating factors include rhinitis/postnasal drainage, season change (fall especially). Has now had a few months with reasonably good control on LABA/ICS, does prefer breo.  # Daytime sleepiness # History of OSA Thyroid ok per pt. Has a leaky CPAP mask.  Plan: - flonase  - can breo on as needed basis.  If you know you're gonna be outside a lot during difficult seasons like fall, spring then use your breo 1 puff 30 minutes before you go out. If breathing substantially worse then go back to using it 1 puff once daily, can even increase to 2 puffs daily for a few days but send me a message if you have to do this - as needed albuterol inhaler or nebulizer for now  followed by flutter valve 10 slow but firm puffs twice daily   - ok to continue singulair for now       Omar Person, MD [facility] 12/04/2021 4:33 PM

## 2021-12-05 ENCOUNTER — Ambulatory Visit (INDEPENDENT_AMBULATORY_CARE_PROVIDER_SITE_OTHER): Payer: Medicare Other | Admitting: Student

## 2021-12-05 ENCOUNTER — Encounter: Payer: Self-pay | Admitting: Student

## 2021-12-05 VITALS — BP 126/76 | HR 78 | Temp 98.0°F | Ht 59.0 in | Wt 239.0 lb

## 2021-12-05 DIAGNOSIS — G4733 Obstructive sleep apnea (adult) (pediatric): Secondary | ICD-10-CM

## 2021-12-05 DIAGNOSIS — J4520 Mild intermittent asthma, uncomplicated: Secondary | ICD-10-CM

## 2021-12-05 DIAGNOSIS — Z9989 Dependence on other enabling machines and devices: Secondary | ICD-10-CM | POA: Diagnosis not present

## 2021-12-05 NOTE — Patient Instructions (Addendum)
-   Can use breo on as needed basis.  If you know you're gonna be outside a lot during difficult seasons like fall, spring then use your breo 1 puff 30 minutes before you go out. If breathing substantially worse then go back to using it 1 puff once daily, can even increase to 2 puffs daily for a few days but send me a message if you have to do this - keep using singulair 10 mg daily - we'll see if RT from [facility] can come out to check on your mask leak - see you in September or sooner if need be!

## 2022-02-07 ENCOUNTER — Telehealth (HOSPITAL_BASED_OUTPATIENT_CLINIC_OR_DEPARTMENT_OTHER): Payer: Self-pay | Admitting: *Deleted

## 2022-02-07 DIAGNOSIS — Z780 Asymptomatic menopausal state: Secondary | ICD-10-CM

## 2022-02-07 NOTE — Telephone Encounter (Signed)
Pt called requesting refills on her medications that Dr. Hyacinth Meeker had mentioned she would send locally [facility]. Advised that Dr. Hyacinth Meeker is currently out of the office but I would send a message to the covering provider to see if she can send the prescriptions.

## 2022-02-08 MED ORDER — NONFORMULARY OR COMPOUNDED ITEM: 11 refills | Status: DC

## 2022-02-08 MED ORDER — NONFORMULARY OR COMPOUNDED ITEM: 3 refills | Status: DC

## 2022-02-08 MED ORDER — NONFORMULARY OR COMPOUNDED ITEM
3 refills | Status: DC
Start: 2022-02-08 — End: 2023-01-20

## 2022-02-08 MED ORDER — NONFORMULARY OR COMPOUNDED ITEM
11 refills | Status: DC
Start: 2022-02-08 — End: 2023-01-20

## 2022-02-08 NOTE — Telephone Encounter (Signed)
Prescriptions for compounded estrogen and progesterone ordered. Will fax to [facility].

## 2022-02-09 ENCOUNTER — Ambulatory Visit
Admission: EM | Admit: 2022-02-09 | Discharge: 2022-02-09 | Disposition: A | Discharge status: Home / Self Care | Payer: Medicare Other | Attending: Emergency Medicine | Admitting: Emergency Medicine

## 2022-02-09 DIAGNOSIS — L209 Atopic dermatitis, unspecified: Secondary | ICD-10-CM | POA: Diagnosis not present

## 2022-02-09 MED ORDER — TRIAMCINOLONE ACETONIDE 0.025 % EX CREA: 1.0000 | TOPICAL_CREAM | Freq: Two times a day (BID) | CUTANEOUS | 0 refills | Status: DC

## 2022-02-09 NOTE — ED Triage Notes (Signed)
The patient states for two weeks she has had crusting behind her left ear.

## 2022-02-09 NOTE — Discharge Instructions (Addendum)
Please begin applying triamcinolone cream to the affected areas behind each ear twice daily.  Once the cream has absorbed into your skin, please also apply Eucerin original healing moisture to keep the steroid in total to create a moisture barrier to protect the skin.  If you do not see meaningful improvement of the rash in the next 3 to 5 days, please reach out to your primary care provider for further evaluation and treatment.  Thank you for visiting urgent care today.

## 2022-02-09 NOTE — ED Provider Notes (Signed)
[facility]    CSN: [id number] Arrival date & time: 02/09/22  1255    HISTORY   Chief Complaint  Patient presents with   ear crusting    HPI Kayla Ray is a pleasant, 71 y.o. female who presents to urgent care today. The patient states for two weeks she has had crusting behind her left ear.  She states she has been scrubbing the area with alcohol, soap and hydrogen peroxide and states initially the skin will feel smoother but then the crustiness returns after a few days.  The history is provided by the patient.   Past Medical History:  Diagnosis Date   Asthma    Diabetes mellitus without complication [facility])    Patient Active Problem List   Diagnosis Date Noted   History of postmenopausal HRT 10/26/2021   History of hysterectomy 10/26/2021   Body mass index (BMI) 45.0-49.9, adult (HCC) 10/25/2021   Glaucoma 10/25/2021   Hardening of the aorta (main artery of the heart) (HCC) 10/25/2021   Hyperglycemia due to type 2 diabetes mellitus (HCC) 10/25/2021   Hypothyroidism 10/25/2021   Insomnia 10/25/2021   Major depression in full remission (HCC) 10/25/2021   Obstructive sleep apnea (adult) (pediatric) 10/25/2021   Mixed hyperlipidemia 10/25/2021   Intermittent asthma 07/13/2021   Hx of intrinsic asthma 03/30/2021   Past Surgical History:  Procedure Laterality Date   BREAST BIOPSY Right 2019   CATARACT EXTRACTION     CESAREAN SECTION     x2   GALLBLADDER SURGERY     VAGINAL HYSTERECTOMY  1984   OB History     Gravida  2   Para  2   Term      Preterm      AB      Living  2      SAB      IAB      Ectopic      Multiple      Live Births             Home Medications    Prior to Admission medications   Medication Sig Start Date End Date Taking? Authorizing Provider  albuterol (PROVENTIL HFA;VENTOLIN HFA) 108 (90 Base) MCG/ACT inhaler Inhale 1-2 puffs into the lungs every 6 (six) hours as needed for wheezing or shortness of breath.     [provider]  albuterol (PROVENTIL) (2.5 MG/3ML) 0.083% nebulizer solution Take 2.5 mg by nebulization every 6 (six) hours as needed for wheezing or shortness of breath.    [provider]  atorvastatin (LIPITOR) 10 MG tablet Take 10 mg by mouth daily. 11/03/17   [provider]  bimatoprost (LUMIGAN) 0.01 % SOLN 1 drop at bedtime.    [provider]  brimonidine-timolol (COMBIGAN) 0.2-0.5 % ophthalmic solution Place 1 drop into both eyes every 12 (twelve) hours.    [provider]  Dulaglutide 0.75 MG/0.5ML SOPN Inject into the skin.    [provider]  fluticasone furoate-vilanterol (BREO ELLIPTA) 100-25 MCG/ACT AEPB Inhale 1 puff into the lungs daily. 08/14/21   Omar Person, MD  levothyroxine (SYNTHROID, LEVOTHROID) 50 MCG tablet Take 50 mcg by mouth daily before breakfast.    [provider]  MELATONIN PO Take 1 tablet by mouth at bedtime as needed (sleep).    [provider]  montelukast (SINGULAIR) 10 MG tablet Take 1 tablet (10 mg total) by mouth at bedtime. 08/14/21   Omar Person, MD  multivitamin-lutein [facility]) CAPS capsule Take  1 capsule by mouth daily.    [provider]  NONFORMULARY OR COMPOUNDED ITEM Biestrogen 60/40 0.375mg /ml cream Apply 6 clicks (0.20ml) to inner thigh or forearm daily Disp: 30 gram, Refills: 11 02/08/22   Marguerita Beards, MD  NONFORMULARY OR COMPOUNDED ITEM Progesterone 250mg  capsule Take one capsule daily at bedtime Disp: 90 capsules, Refills: 3 02/08/22   04/10/22, MD  PRESCRIPTION MEDICATION 1 each daily.    [provider]  Spacer/Aero-Holding Chambers (AEROCHAMBER MV) inhaler Use as instructed 06/21/21   06/23/21, MD  traZODone (DESYREL) 150 MG tablet Take 150 mg by mouth at bedtime.    [provider]  venlafaxine XR (EFFEXOR-XR) 150 MG 24 hr capsule Take 150 mg by mouth 2 (two) times daily.    Omar Person,  MD    Family History Family History  Problem Relation Age of Onset   Diabetes Mother    Heart disease Mother    Cancer Sister        lymphoma   Heart disease Sister    Diabetes Maternal Grandmother    Social History Social History   Tobacco Use   Smoking status: Never   Smokeless tobacco: Never  Vaping Use   Vaping Use: Never used  Substance Use Topics   Alcohol use: Yes    Comment: OCC   Drug use: Never   Allergies   Bactrim [sulfamethoxazole-trimethoprim], Glimepiride, Jardiance [empagliflozin], and Metformin and related  Review of Systems Review of Systems Pertinent findings revealed after performing a 14 point review of systems has been noted in the history of present illness.  Physical Exam Triage Vital Signs ED Triage Vitals  Enc Vitals Group     BP 04/27/21 0827 (!) 147/82     Pulse Rate 04/27/21 0827 72     Resp 04/27/21 0827 18     Temp 04/27/21 0827 98.3 F (36.8 C)     Temp Source 04/27/21 0827 Oral     SpO2 04/27/21 0827 98 %     Weight --      Height --      Head Circumference --      Peak Flow --      Pain Score 04/27/21 0826 5     Pain Loc --      Pain Edu? --      Excl. in GC? --   No data found.  Updated Vital Signs BP 107/69 (BP Location: Left Arm)   Pulse 87   Temp 98.2 F (36.8 C) (Oral)   Resp 16   SpO2 94%   Physical Exam Vitals and nursing note reviewed.  Constitutional:      General: She is not in acute distress.    Appearance: Normal appearance.  HENT:     Head: Normocephalic and atraumatic.     Ears:     Comments: Areas of atopy behind both earlobes concerning for dermatitis. Eyes:     Pupils: Pupils are equal, round, and reactive to light.  Cardiovascular:     Rate and Rhythm: Normal rate and regular rhythm.  Pulmonary:     Effort: Pulmonary effort is normal.     Breath sounds: Normal breath sounds.  Musculoskeletal:        General: Normal range of motion.     Cervical back: Normal range of motion and neck  supple.  Skin:    General: Skin is warm and dry.  Neurological:     General: No focal deficit present.  Mental Status: She is alert and oriented to person, place, and time. Mental status is at baseline.  Psychiatric:        Mood and Affect: Mood normal.        Behavior: Behavior normal.        Thought Content: Thought content normal.        Judgment: Judgment normal.     Visual Acuity Right Eye Distance:   Left Eye Distance:   Bilateral Distance:    Right Eye Near:   Left Eye Near:    Bilateral Near:     UC Couse / Diagnostics / Procedures:     Radiology No results found.  Procedures Procedures (including critical care time) EKG  Pending results:  Labs Reviewed - No data to display  Medications Ordered in UC: Medications - No data to display  UC Diagnoses / Final Clinical Impressions(s)   I have reviewed the triage vital signs and the nursing notes.  Pertinent labs & imaging results that were available during my care of the patient were reviewed by me and considered in my medical decision making (see chart for details).    Final diagnoses:  Atopic dermatitis, mild   Patient was encouraged to avoid attempts to scrub the areas behind her ears cleaned with caustic solutions.  Patient advised instead to only use a mild detergent and not to physically wash the area but allow soapy water to run over them.  Patient provided with prescription for Kenalog cream to be applied twice daily and advised to moisturize the area with an hypoallergenic moisturizer after each Kenalog application.  Return precautions advised.  ED Prescriptions     Medication Sig Dispense Auth. Provider   triamcinolone (KENALOG) 0.025 % cream Apply 1 Application topically 2 (two) times daily. Apply to affected area(s) on face twice daily 30 g Theadora Rama Scales, PA-C      PDMP not reviewed this encounter.  Pending results:  Labs Reviewed - No data to display  Discharge  Instructions:   Discharge Instructions      Please begin applying triamcinolone cream to the affected areas behind each ear twice daily.  Once the cream has absorbed into your skin, please also apply Eucerin original healing moisture to keep the steroid in total to create a moisture barrier to protect the skin.  If you do not see meaningful improvement of the rash in the next 3 to 5 days, please reach out to your primary care provider for further evaluation and treatment.  Thank you for visiting urgent care today.      Disposition Upon Discharge:  Condition: stable for discharge home  Patient presented with an acute illness with associated systemic symptoms and significant discomfort requiring urgent management. In my opinion, this is a condition that a prudent lay person (someone who possesses an average knowledge of health and medicine) may potentially expect to result in complications if not addressed urgently such as respiratory distress, impairment of bodily function or dysfunction of bodily organs.   Routine symptom specific, illness specific and/or disease specific instructions were discussed with the patient and/or caregiver at length.   As such, the patient has been evaluated and assessed, work-up was performed and treatment was provided in alignment with urgent care protocols and evidence based medicine.  Patient/parent/caregiver has been advised that the patient may require follow up for further testing and treatment if the symptoms continue in spite of treatment, as clinically indicated and appropriate.  Patient/parent/caregiver has been advised to return  to the [facility] or PCP if no better; to PCP or the Emergency Department if new signs and symptoms develop, or if the current signs or symptoms continue to change or worsen for further workup, evaluation and treatment as clinically indicated and appropriate  The patient will follow up with their current PCP if and as advised. If the  patient does not currently have a PCP we will assist them in obtaining one.   The patient may need specialty follow up if the symptoms continue, in spite of conservative treatment and management, for further workup, evaluation, consultation and treatment as clinically indicated and appropriate.   Patient/parent/caregiver verbalized understanding and agreement of plan as discussed.  All questions were addressed during visit.  Please see discharge instructions below for further details of plan.  This office note has been dictated using Teaching laboratory technician.  Unfortunately, this method of dictation can sometimes lead to typographical or grammatical errors.  I apologize for your inconvenience in advance if this occurs.  Please do not hesitate to reach out to me if clarification is needed.      Theadora Rama Scales, PA-C 02/11/22 1450

## 2022-02-12 DIAGNOSIS — Z961 Presence of intraocular lens: Secondary | ICD-10-CM | POA: Diagnosis not present

## 2022-02-12 DIAGNOSIS — H0102A Squamous blepharitis right eye, upper and lower eyelids: Secondary | ICD-10-CM | POA: Diagnosis not present

## 2022-02-12 DIAGNOSIS — H0102B Squamous blepharitis left eye, upper and lower eyelids: Secondary | ICD-10-CM | POA: Diagnosis not present

## 2022-02-12 DIAGNOSIS — H10413 Chronic giant papillary conjunctivitis, bilateral: Secondary | ICD-10-CM | POA: Diagnosis not present

## 2022-02-12 DIAGNOSIS — E119 Type 2 diabetes mellitus without complications: Secondary | ICD-10-CM | POA: Diagnosis not present

## 2022-02-12 DIAGNOSIS — H401131 Primary open-angle glaucoma, bilateral, mild stage: Secondary | ICD-10-CM | POA: Diagnosis not present

## 2022-02-19 DIAGNOSIS — R21 Rash and other nonspecific skin eruption: Secondary | ICD-10-CM | POA: Diagnosis not present

## 2022-03-05 NOTE — Progress Notes (Signed)
fl  Synopsis: Referred for asthma/copd by Georgann Housekeeper, MD  Subjective:   PATIENT ID: Kayla Ray GENDER: female DOB: Dec 28, 1950, MRN: [id number]  Chief Complaint  Patient presents with   Follow-up    Breathing has been some worse with hot, humid weather. She has occ cough- non prod. She rarely uses her albuterol.     70yF with chart history of adult-onset asthma, DM not requiring insulin, depression/anxiety, glaucoma, OSA on CPAP - actively follows with Dr. Earl Gala with [facility], had sinus surgery in the remote past for a deviated septum.  Her husband says that she often gets asthma exacerbations as she's getting over a cold, especially if she's not on top of her bronchodilators via nebulizer. When she has an exacerbation she coughs quite a bit, productive. Usually prednisone is helpful but less helpful during this acute illness. Tried azithromycin as well. DOE during an attack to less than a block.  She was then recently at [facility] for asthma exacerbation. She does think the doxycycline may have been helpful. Last dose of doxy and prednisone today. Productive cough is better.   She has some rhinorrhea but no sinonasal congestion, doesn't report frequent trouble with sinonasal congestion. In past she had more trouble with GERD and still takes prilosec periodically for it. Her asthma does flare up in spring/fall typically.    Inhalers tried previously: Albuterol, perhaps breo but she is unsure  Her father had lung cancer. Sister has COPD.  She has always done administrative work, Hotel manager. Have previously lived in [place], [place], [place], [place]. No pets/birds at home.     Interval HPI: Last seen 6/7 continuing breo on as needed basis, flonase, SABA/flutter valve. Asked RT from [facility] to help assess mask leak issues with CPAP but no one ever came. Still having a lot of leak with mas.   Heat/humidity not doing her any favors. Has double dosed breo 2-3x.  They were spread out and not for more than a day or so. Last time was last night. Still using singulair daily, rinsing mouth after using inhaler.   No courses of ABX or steroids since last visit.   Has minor postnasal drainage. No overt heartburn that she's noticed.   Otherwise pertinent review of systems is negative.  Past Medical History:  Diagnosis Date   Asthma    Diabetes mellitus without complication (HCC)      Family History  Problem Relation Age of Onset   Diabetes Mother    Heart disease Mother    Cancer Sister        lymphoma   Heart disease Sister    Diabetes Maternal Grandmother      Past Surgical History:  Procedure Laterality Date   BREAST BIOPSY Right 2019   CATARACT EXTRACTION     CESAREAN SECTION     x2   GALLBLADDER SURGERY     VAGINAL HYSTERECTOMY  1984    Social History   Socioeconomic History   Marital status: Married    Spouse name: Not on file   Number of children: Not on file   Years of education: Not on file   Highest education level: Not on file  Occupational History   Not on file  Tobacco Use   Smoking status: Never   Smokeless tobacco: Never  Vaping Use   Vaping Use: Never used  Substance and Sexual Activity   Alcohol use: Yes    Comment: OCC   Drug use: Never   Sexual activity:  Not Currently    Partners: Male    Comment: 1st intercourse- 48, married- 45 yrs   Other Topics Concern   Not on file  Social History Narrative   Not on file   Social Determinants of Health   Financial Resource Strain: Not on file  Food Insecurity: Not on file  Transportation Needs: Not on file  Physical Activity: Not on file  Stress: Not on file  Social Connections: Not on file  Intimate Partner Violence: Not on file     Allergies  Allergen Reactions   Bactrim [Sulfamethoxazole-Trimethoprim] Rash    Chest tightness   Glimepiride Rash   Jardiance [Empagliflozin] Other (See Comments)   Metformin And Related Diarrhea     Outpatient  Medications Prior to Visit  Medication Sig Dispense Refill   albuterol (PROVENTIL HFA;VENTOLIN HFA) 108 (90 Base) MCG/ACT inhaler Inhale 1-2 puffs into the lungs every 6 (six) hours as needed for wheezing or shortness of breath.     albuterol (PROVENTIL) (2.5 MG/3ML) 0.083% nebulizer solution Take 2.5 mg by nebulization every 6 (six) hours as needed for wheezing or shortness of breath.     atorvastatin (LIPITOR) 10 MG tablet Take 10 mg by mouth daily.  3   bimatoprost (LUMIGAN) 0.01 % SOLN 1 drop at bedtime.     brimonidine-timolol (COMBIGAN) 0.2-0.5 % ophthalmic solution Place 1 drop into both eyes every 12 (twelve) hours.     fluticasone furoate-vilanterol (BREO ELLIPTA) 100-25 MCG/ACT AEPB Inhale 1 puff into the lungs daily. 60 each 11   levothyroxine (SYNTHROID, LEVOTHROID) 50 MCG tablet Take 50 mcg by mouth daily before breakfast.     MELATONIN PO Take 1 tablet by mouth at bedtime as needed (sleep).     montelukast (SINGULAIR) 10 MG tablet Take 1 tablet (10 mg total) by mouth at bedtime. 90 tablet 3   multivitamin-lutein (OCUVITE-LUTEIN) CAPS capsule Take 1 capsule by mouth daily.     NONFORMULARY OR COMPOUNDED ITEM Biestrogen 60/40 0.375mg /ml cream Apply 6 clicks (0.95ml) to inner thigh or forearm daily Disp: 30 gram, Refills: 11 30 each 11   NONFORMULARY OR COMPOUNDED ITEM Progesterone 250mg  capsule Take one capsule daily at bedtime Disp: 90 capsules, Refills: 3 90 each 3   PRESCRIPTION MEDICATION 1 each daily.     Spacer/Aero-Holding Chambers (AEROCHAMBER MV) inhaler Use as instructed 1 each 5   traZODone (DESYREL) 150 MG tablet Take 150 mg by mouth at bedtime.     triamcinolone (KENALOG) 0.025 % cream Apply 1 Application topically 2 (two) times daily. Apply to affected area(s) on face twice daily 30 g 0   TRULICITY 1.5 MG/0.5ML SOPN As directed     venlafaxine XR (EFFEXOR-XR) 150 MG 24 hr capsule Take 150 mg by mouth 2 (two) times daily.     Dulaglutide 0.75 MG/0.5ML SOPN Inject into  the skin.     No facility-administered medications prior to visit.       Objective:   Physical Exam:  General appearance: 71 y.o., female, NAD, conversant  Eyes: anicteric sclerae; PERRL, tracking appropriately HENT: NCAT; MMM Neck: Trachea midline; no lymphadenopathy, no JVD Lungs: CTAB, no crackles, no wheeze, with normal respiratory effort CV: RRR, no murmur  Abdomen: Soft, non-tender; non-distended, BS present  Extremities: No peripheral edema, warm Skin: Normal turgor and texture; no rash Psych: Appropriate affect Neuro: Alert and oriented to person and place, no focal deficit     Vitals:   03/06/22 1105  BP: 112/72  Pulse: 91  Temp: 98.4 F (  36.9 C)  TempSrc: Oral  SpO2: 98%  Weight: 241 lb (109.3 kg)  Height: 4\' 11"  (1.499 m)       98% on RA BMI Readings from Last 3 Encounters:  03/06/22 48.68 kg/m  12/05/21 48.27 kg/m  10/25/21 48.60 kg/m   Wt Readings from Last 3 Encounters:  03/06/22 241 lb (109.3 kg)  12/05/21 239 lb (108.4 kg)  10/25/21 240 lb 9.6 oz (109.1 kg)     CBC    Component Value Date/Time   WBC 12.3 (H) 02/13/2021 1545   RBC 4.54 02/13/2021 1545   HGB 12.5 02/13/2021 1545   HCT 37.8 02/13/2021 1545   PLT 347 02/13/2021 1545   MCV 83.3 02/13/2021 1545   MCH 27.5 02/13/2021 1545   MCHC 33.1 02/13/2021 1545   RDW 16.0 (H) 02/13/2021 1545     Chest Imaging: CXR 02/13/21 with ?RML mucoid impaction  Pulmonary Functions Testing Results:    Latest Ref Rng & Units 04/10/2021   12:37 PM 03/22/2021   10:45 AM  PFT Results  FVC-Pre L 1.94  1.96   FVC-Predicted Pre % 79  80   FVC-Post L 1.89  1.96   FVC-Predicted Post % 77  80   Pre FEV1/FVC % % 81  83   Post FEV1/FCV % % 82  84   FEV1-Pre L 1.58  1.62   FEV1-Predicted Pre % 85  88   FEV1-Post L 1.55  1.65   DLCO uncorrected ml/min/mmHg  15.11   DLCO UNC% %  92   DLCO corrected ml/min/mmHg  15.56   DLCO COR %Predicted %  95   DLVA Predicted %  123   TLC L  3.67    TLC % Predicted %  85   RV % Predicted %  74    03/22/21: reviewed by me, normal 04/10/21: meth challenge was borderline AHR with drop in FEV1 below 80% from baseline only after 16 mg/mL     Assessment & Plan:   # Cough # Dyspnea on exertion # Intermittent asthma  Could be consistent with mild intermittent asthma, does have borderline AHR on methacholine challenge 04/10/21 despite remaining on singulair. Triggers/exacerbating factors include rhinitis/postnasal drainage, season change (fall especially). Doing well with intermittent to daily low dose breo, occasional twice daily use  # Daytime sleepiness # History of OSA Thyroid ok per pt. Has a leaky CPAP mask (95% leak is 55L!) on review of download today but AHI is ok still (~3-4). Saw Dr. 06/10/21 in past for this. [employer] is her DME supplier. Resmed machine - she thinks autopap 5-15. Has tried mask liner.   Plan: - take shower, clear nose of crusting and then use flonase 1 spray each nostril for at least 8 weeks to knock out postnasal drainage. Can also add OTC antihistamine like claritin or allegra if this is insufficient. - Can use breo on as needed basis.  If you know you're gonna be outside a lot during difficult seasons like fall, spring then use your breo 1 puff 30 minutes before you go out. If breathing substantially worse then go back to using it 1 puff once daily, can even increase to 2 puffs daily for a few days - if this doesn't take care of it then give [id number] a call or send message - keep using singulair 10 mg daily - we placed order for nasal pillows which she prefers - see you in 6 months or sooner if need be  Omar Person, MD [facility] 03/06/2022 11:35 AM

## 2022-03-06 ENCOUNTER — Ambulatory Visit (INDEPENDENT_AMBULATORY_CARE_PROVIDER_SITE_OTHER): Payer: Medicare Other | Admitting: Student

## 2022-03-06 ENCOUNTER — Encounter: Payer: Self-pay | Admitting: Student

## 2022-03-06 VITALS — BP 112/72 | HR 91 | Temp 98.4°F | Ht 59.0 in | Wt 241.0 lb

## 2022-03-06 DIAGNOSIS — Z9989 Dependence on other enabling machines and devices: Secondary | ICD-10-CM

## 2022-03-06 DIAGNOSIS — G4733 Obstructive sleep apnea (adult) (pediatric): Secondary | ICD-10-CM | POA: Diagnosis not present

## 2022-03-06 DIAGNOSIS — J4530 Mild persistent asthma, uncomplicated: Secondary | ICD-10-CM | POA: Diagnosis not present

## 2022-03-06 MED ORDER — FLUTICASONE PROPIONATE 50 MCG/ACT NA SUSP: 1.0000 | Freq: Every day | NASAL | 11 refills | Status: DC

## 2022-03-06 NOTE — Patient Instructions (Addendum)
-   take shower, clear nose of crusting and then use flonase 1 spray each nostril for at least 8 weeks to knock out postnasal drainage. Can also add OTC antihistamine like claritin or allegra if this is insufficient. - Can use breo on as needed basis.  If you know you're gonna be outside a lot during difficult seasons like fall, spring then use your breo 1 puff 30 minutes before you go out. If breathing substantially worse then go back to using it 1 puff once daily, can even increase to 2 puffs daily for a few days - if this doesn't take care of it then give [place] a call or send message - keep using singulair 10 mg daily - we'll see again if RT from [employer] can come out to check on your mask leak - see you in 6 months or sooner if need be

## 2022-03-27 DIAGNOSIS — F3341 Major depressive disorder, recurrent, in partial remission: Secondary | ICD-10-CM | POA: Diagnosis not present

## 2022-03-28 DIAGNOSIS — E039 Hypothyroidism, unspecified: Secondary | ICD-10-CM | POA: Diagnosis not present

## 2022-03-28 DIAGNOSIS — E782 Mixed hyperlipidemia: Secondary | ICD-10-CM | POA: Diagnosis not present

## 2022-03-28 DIAGNOSIS — G4700 Insomnia, unspecified: Secondary | ICD-10-CM | POA: Diagnosis not present

## 2022-03-28 DIAGNOSIS — G4733 Obstructive sleep apnea (adult) (pediatric): Secondary | ICD-10-CM | POA: Diagnosis not present

## 2022-03-28 DIAGNOSIS — J4530 Mild persistent asthma, uncomplicated: Secondary | ICD-10-CM | POA: Diagnosis not present

## 2022-03-28 DIAGNOSIS — F331 Major depressive disorder, recurrent, moderate: Secondary | ICD-10-CM | POA: Diagnosis not present

## 2022-03-28 DIAGNOSIS — E1169 Type 2 diabetes mellitus with other specified complication: Secondary | ICD-10-CM | POA: Diagnosis not present

## 2022-03-28 DIAGNOSIS — Z0000 Encounter for general adult medical examination without abnormal findings: Secondary | ICD-10-CM | POA: Diagnosis not present

## 2022-03-28 DIAGNOSIS — I700 Atherosclerosis of aorta: Secondary | ICD-10-CM | POA: Diagnosis not present

## 2022-05-01 IMAGING — DX DG CHEST 2V
2 series · 2 of 2 positions shown · non-contrast
Comparison: None.

CLINICAL DATA: Productive cough.

EXAM:
CHEST - 2 VIEW

[chest pa]
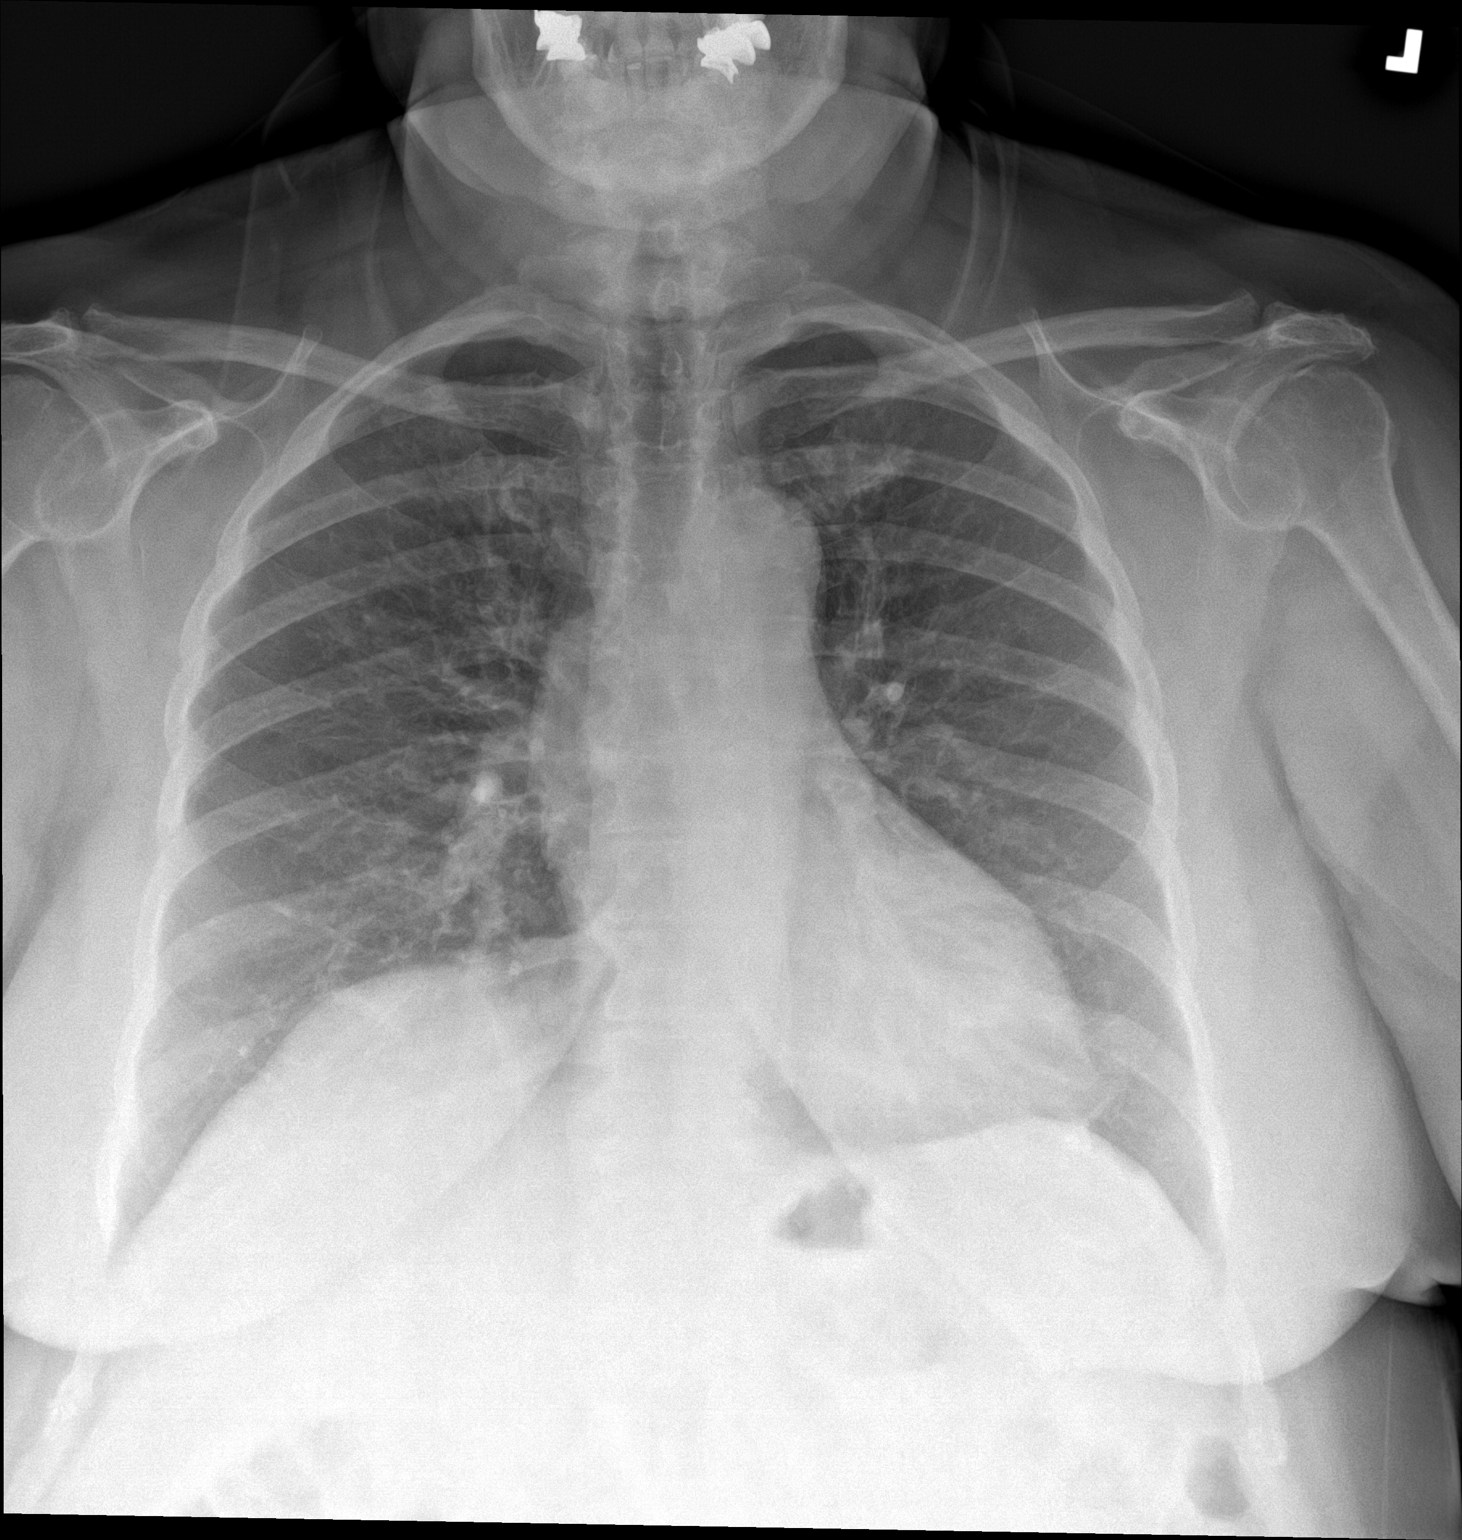

[chest lat]
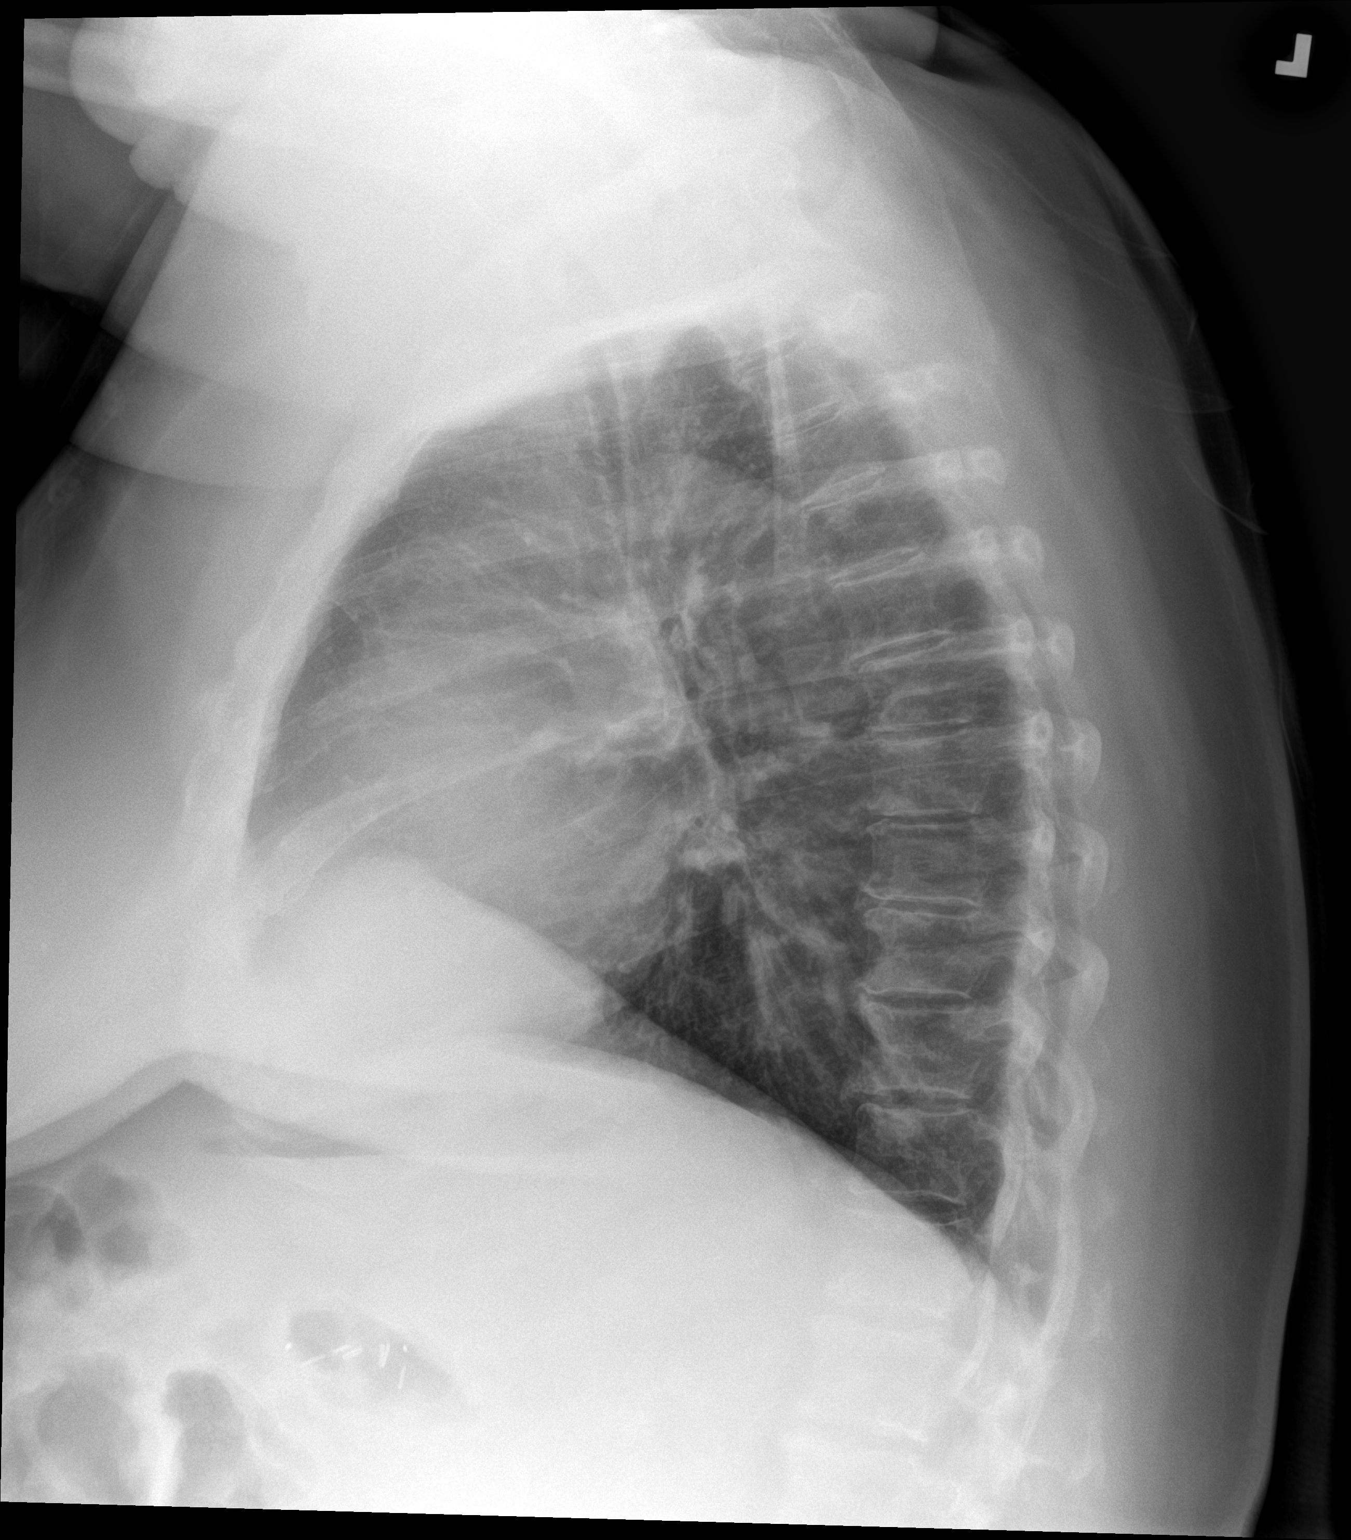

[2 of 2 positions shown; findings below may reference images not displayed]

FINDINGS: The heart size and mediastinal contours are within normal limits.
Both lungs are clear. The visualized skeletal structures are
unremarkable.
IMPRESSION: No active cardiopulmonary disease.

## 2022-05-15 DIAGNOSIS — G4733 Obstructive sleep apnea (adult) (pediatric): Secondary | ICD-10-CM | POA: Diagnosis not present

## 2022-06-18 DIAGNOSIS — H401131 Primary open-angle glaucoma, bilateral, mild stage: Secondary | ICD-10-CM | POA: Diagnosis not present

## 2022-06-18 DIAGNOSIS — Z961 Presence of intraocular lens: Secondary | ICD-10-CM | POA: Diagnosis not present

## 2022-06-18 DIAGNOSIS — H10413 Chronic giant papillary conjunctivitis, bilateral: Secondary | ICD-10-CM | POA: Diagnosis not present

## 2022-06-18 DIAGNOSIS — H0102A Squamous blepharitis right eye, upper and lower eyelids: Secondary | ICD-10-CM | POA: Diagnosis not present

## 2022-06-18 DIAGNOSIS — E119 Type 2 diabetes mellitus without complications: Secondary | ICD-10-CM | POA: Diagnosis not present

## 2022-06-18 DIAGNOSIS — H0102B Squamous blepharitis left eye, upper and lower eyelids: Secondary | ICD-10-CM | POA: Diagnosis not present

## 2022-08-01 DIAGNOSIS — Z961 Presence of intraocular lens: Secondary | ICD-10-CM | POA: Diagnosis not present

## 2022-08-01 DIAGNOSIS — H401131 Primary open-angle glaucoma, bilateral, mild stage: Secondary | ICD-10-CM | POA: Diagnosis not present

## 2022-08-01 DIAGNOSIS — H10413 Chronic giant papillary conjunctivitis, bilateral: Secondary | ICD-10-CM | POA: Diagnosis not present

## 2022-08-24 ENCOUNTER — Other Ambulatory Visit: Payer: Self-pay | Admitting: Student

## 2022-08-29 DIAGNOSIS — K080 Exfoliation of teeth due to systemic causes: Secondary | ICD-10-CM | POA: Diagnosis not present

## 2022-08-29 NOTE — Progress Notes (Deleted)
fl  Synopsis: Referred for asthma/copd by Wenda Low, MD  Subjective:   PATIENT ID: Kayla Ray GENDER: female DOB: [birth year], MRN: [id number]  No chief complaint on file.   70yF with chart history of adult-onset asthma, DM not requiring insulin, depression/anxiety, glaucoma, OSA on CPAP - actively follows with Dr. Maxwell Caul with [facility], had sinus surgery in the remote past for a deviated septum.  Her husband says that she often gets asthma exacerbations as she's getting over a cold, especially if she's not on top of her bronchodilators via nebulizer. When she has an exacerbation she coughs quite a bit, productive. Usually prednisone is helpful but less helpful during this acute illness. Tried azithromycin as well. DOE during an attack to less than a block.  She was then recently at [facility] for asthma exacerbation. She does think the doxycycline may have been helpful. Last dose of doxy and prednisone today. Productive cough is better.   She has some rhinorrhea but no sinonasal congestion, doesn't report frequent trouble with sinonasal congestion. In past she had more trouble with GERD and still takes prilosec periodically for it. Her asthma does flare up in spring/fall typically.    Inhalers tried previously: Albuterol, perhaps breo but she is unsure  Her father had lung cancer. Sister has COPD.  She has always done administrative work, Agricultural consultant. Have previously lived in [place], [place], [place], [name]. No pets/birds at home.     Interval HPI: Last seen 6/7 continuing breo on as needed basis, flonase, SABA/flutter valve. Asked RT from [employer] to help assess mask leak issues with CPAP but no one ever came. Still having a lot of leak with mas.   Heat/humidity not doing her any favors. Has double dosed breo 2-3x. They were spread out and not for more than a day or so. Last time was last night. Still using singulair daily, rinsing mouth after using inhaler.    No courses of ABX or steroids since last visit.   Has minor postnasal drainage. No overt heartburn that she's noticed.  ----------------------------------------------------------------------- Flonase, prn nonsedating antihistamine encouraged last visit for PND  Otherwise pertinent review of systems is negative.  Past Medical History:  Diagnosis Date   Asthma    Diabetes mellitus without complication ([place])      Family History  Problem Relation Age of Onset   Diabetes Mother    Heart disease Mother    Cancer Sister        lymphoma   Heart disease Sister    Diabetes Maternal Grandmother      Past Surgical History:  Procedure Laterality Date   BREAST BIOPSY Right 2019   CATARACT EXTRACTION     CESAREAN SECTION     x2   GALLBLADDER SURGERY     VAGINAL HYSTERECTOMY  1984    Social History   Socioeconomic History   Marital status: Married    Spouse name: Not on file   Number of children: Not on file   Years of education: Not on file   Highest education level: Not on file  Occupational History   Not on file  Tobacco Use   Smoking status: Never   Smokeless tobacco: Never  Vaping Use   Vaping Use: Never used  Substance and Sexual Activity   Alcohol use: Yes    Comment: OCC   Drug use: Never   Sexual activity: Not Currently    Partners: Male    Comment: 1st intercourse- 89, married- 46 yrs  Other Topics Concern   Not on file  Social History Narrative   Not on file   Social Determinants of Health   Financial Resource Strain: Not on file  Food Insecurity: Not on file  Transportation Needs: Not on file  Physical Activity: Not on file  Stress: Not on file  Social Connections: Not on file  Intimate Partner Violence: Not on file     Allergies  Allergen Reactions   Bactrim [Sulfamethoxazole-Trimethoprim] Rash    Chest tightness   Glimepiride Rash   Jardiance [Empagliflozin] Other (See Comments)   Metformin And Related Diarrhea     Outpatient  Medications Prior to Visit  Medication Sig Dispense Refill   albuterol (PROVENTIL HFA;VENTOLIN HFA) 108 (90 Base) MCG/ACT inhaler Inhale 1-2 puffs into the lungs every 6 (six) hours as needed for wheezing or shortness of breath.     albuterol (PROVENTIL) (2.5 MG/3ML) 0.083% nebulizer solution Take 2.5 mg by nebulization every 6 (six) hours as needed for wheezing or shortness of breath.     atorvastatin (LIPITOR) 10 MG tablet Take 10 mg by mouth daily.  3   bimatoprost (LUMIGAN) 0.01 % SOLN 1 drop at bedtime.     brimonidine-timolol (COMBIGAN) 0.2-0.5 % ophthalmic solution Place 1 drop into both eyes every 12 (twelve) hours.     fluticasone (FLONASE) 50 MCG/ACT nasal spray Place 1 spray into both nostrils daily. 16 g 11   fluticasone furoate-vilanterol (BREO ELLIPTA) 100-25 MCG/ACT AEPB Inhale 1 puff into the lungs daily. 60 each 11   levothyroxine (SYNTHROID, LEVOTHROID) 50 MCG tablet Take 50 mcg by mouth daily before breakfast.     MELATONIN PO Take 1 tablet by mouth at bedtime as needed (sleep).     montelukast (SINGULAIR) 10 MG tablet TAKE 1 TABLET BY MOUTH EVERYDAY AT BEDTIME 90 tablet 3   multivitamin-lutein (OCUVITE-LUTEIN) CAPS capsule Take 1 capsule by mouth daily.     NONFORMULARY OR COMPOUNDED ITEM Biestrogen 60/40 0.'375mg'$ /ml cream Apply 6 clicks (0000000) to inner thigh or forearm daily Disp: 30 gram, Refills: 11 30 each 11   NONFORMULARY OR COMPOUNDED ITEM Progesterone '250mg'$  capsule Take one capsule daily at bedtime Disp: 90 capsules, Refills: 3 90 each 3   PRESCRIPTION MEDICATION 1 each daily.     Spacer/Aero-Holding Chambers (AEROCHAMBER MV) inhaler Use as instructed 1 each 5   traZODone (DESYREL) 150 MG tablet Take 150 mg by mouth at bedtime.     triamcinolone (KENALOG) 0.025 % cream Apply 1 Application topically 2 (two) times daily. Apply to affected area(s) on face twice daily 30 g 0   TRULICITY 1.5 0000000 SOPN As directed     venlafaxine XR (EFFEXOR-XR) 150 MG 24 hr capsule  Take 150 mg by mouth 2 (two) times daily.     No facility-administered medications prior to visit.       Objective:   Physical Exam:  General appearance: 72 y.o., female, NAD, conversant  Eyes: anicteric sclerae; PERRL, tracking appropriately HENT: NCAT; MMM Neck: Trachea midline; no lymphadenopathy, no JVD Lungs: CTAB, no crackles, no wheeze, with normal respiratory effort CV: RRR, no murmur  Abdomen: Soft, non-tender; non-distended, BS present  Extremities: No peripheral edema, warm Skin: Normal turgor and texture; no rash Psych: Appropriate affect Neuro: Alert and oriented to person and place, no focal deficit     There were no vitals filed for this visit.        on RA BMI Readings from Last 3 Encounters:  03/06/22 48.68 kg/m  12/05/21 48.27  kg/m  10/25/21 48.60 kg/m   Wt Readings from Last 3 Encounters:  03/06/22 241 lb (109.3 kg)  12/05/21 239 lb (108.4 kg)  10/25/21 240 lb 9.6 oz (109.1 kg)     CBC    Component Value Date/Time   WBC 12.3 (H) 02/13/2021 1545   RBC 4.54 02/13/2021 1545   HGB 12.5 02/13/2021 1545   HCT 37.8 02/13/2021 1545   PLT 347 02/13/2021 1545   MCV 83.3 02/13/2021 1545   MCH 27.5 02/13/2021 1545   MCHC 33.1 02/13/2021 1545   RDW 16.0 (H) 02/13/2021 1545     Chest Imaging: CXR 02/13/21 with ?RML mucoid impaction  Pulmonary Functions Testing Results:    Latest Ref Rng & Units 04/10/2021   12:37 PM 03/22/2021   10:45 AM  PFT Results  FVC-Pre L 1.94  1.96   FVC-Predicted Pre % 79  80   FVC-Post L 1.89  1.96   FVC-Predicted Post % 77  80   Pre FEV1/FVC % % 81  83   Post FEV1/FCV % % 82  84   FEV1-Pre L 1.58  1.62   FEV1-Predicted Pre % 85  88   FEV1-Post L 1.55  1.65   DLCO uncorrected ml/min/mmHg  15.11   DLCO UNC% %  92   DLCO corrected ml/min/mmHg  15.56   DLCO COR %Predicted %  95   DLVA Predicted %  123   TLC L  3.67   TLC % Predicted %  85   RV % Predicted %  74    03/22/21: reviewed by me,  normal 04/10/21: meth challenge was borderline AHR with drop in FEV1 below 80% from baseline only after 16 mg/mL     Assessment & Plan:   # Cough # Dyspnea on exertion # Intermittent asthma  Could be consistent with mild intermittent asthma, does have borderline AHR on methacholine challenge 04/10/21 despite remaining on singulair. Triggers/exacerbating factors include rhinitis/postnasal drainage, season change (fall especially). Doing well with intermittent to daily low dose breo, occasional twice daily use  # Daytime sleepiness # History of OSA Thyroid ok per pt. Has a leaky CPAP mask (95% leak is 55L!) on review of download today but AHI is ok still (~3-4). Saw Dr. Maxwell Caul in past for this. [employer] is her DME supplier. Resmed machine - she thinks autopap 5-15. Has tried mask liner.   Plan: - take shower, clear nose of crusting and then use flonase 1 spray each nostril for at least 8 weeks to knock out postnasal drainage. Can also add OTC antihistamine like claritin or allegra if this is insufficient. - Can use breo on as needed basis.  If you know you're gonna be outside a lot during difficult seasons like fall, spring then use your breo 1 puff 30 minutes before you go out. If breathing substantially worse then go back to using it 1 puff once daily, can even increase to 2 puffs daily for a few days - if this doesn't take care of it then give [place] a call or send message - keep using singulair 10 mg daily - we placed order for nasal pillows which she prefers - see you in 6 months or sooner if need be       Maryjane Hurter, MD [facility] 08/29/2022 4:31 PM

## 2022-08-30 ENCOUNTER — Ambulatory Visit: Payer: Medicare Other | Admitting: Student

## 2022-09-03 DIAGNOSIS — K080 Exfoliation of teeth due to systemic causes: Secondary | ICD-10-CM | POA: Diagnosis not present

## 2022-09-16 NOTE — Progress Notes (Unsigned)
fl  Synopsis: Referred for asthma/copd by Wenda Low, MD  Subjective:   PATIENT ID: Kayla Ray GENDER: female DOB: 08-11-1950, MRN: [id number]  No chief complaint on file. CC: cough, dyspnea  70yF with chart history of adult-onset asthma, DM not requiring insulin, depression/anxiety, glaucoma, OSA on CPAP - actively follows with Dr. Maxwell Caul with [facility], had sinus surgery in the remote past for a deviated septum.  Her husband says that she often gets asthma exacerbations as she's getting over a cold, especially if she's not on top of her bronchodilators via nebulizer. When she has an exacerbation she coughs quite a bit, productive. Usually prednisone is helpful but less helpful during this acute illness. Tried azithromycin as well. DOE during an attack to less than a block.  She was then recently at [facility] for asthma exacerbation. She does think the doxycycline may have been helpful. Last dose of doxy and prednisone today. Productive cough is better.   She has some rhinorrhea but no sinonasal congestion, doesn't report frequent trouble with sinonasal congestion. In past she had more trouble with GERD and still takes prilosec periodically for it. Her asthma does flare up in spring/fall typically.    Inhalers tried previously: Albuterol, perhaps breo but she is unsure  Her father had lung cancer. Sister has COPD.  She has always done administrative work, Agricultural consultant. Have previously lived in [place], [place], [place], [name]. No pets/birds at home.     Interval HPI: Last seen 6/7 continuing breo on as needed basis, flonase, SABA/flutter valve. Asked RT from [employer] to help assess mask leak issues with CPAP but no one ever came. Still having a lot of leak with mas.   Heat/humidity not doing her any favors. Has double dosed breo 2-3x. They were spread out and not for more than a day or so. Last time was last night. Still using singulair daily, rinsing mouth after  using inhaler.   No courses of ABX or steroids since last visit.   Has minor postnasal drainage. No overt heartburn that she's noticed.  ----------------------------------------------------------------------- Flonase, prn nonsedating antihistamine encouraged last visit for PND  Otherwise pertinent review of systems is negative.  Past Medical History:  Diagnosis Date   Asthma    Diabetes mellitus without complication ([place])      Family History  Problem Relation Age of Onset   Diabetes Mother    Heart disease Mother    Cancer Sister        lymphoma   Heart disease Sister    Diabetes Maternal Grandmother      Past Surgical History:  Procedure Laterality Date   BREAST BIOPSY Right 2019   CATARACT EXTRACTION     CESAREAN SECTION     x2   GALLBLADDER SURGERY     VAGINAL HYSTERECTOMY  1984    Social History   Socioeconomic History   Marital status: Married    Spouse name: Not on file   Number of children: Not on file   Years of education: Not on file   Highest education level: Not on file  Occupational History   Not on file  Tobacco Use   Smoking status: Never   Smokeless tobacco: Never  Vaping Use   Vaping Use: Never used  Substance and Sexual Activity   Alcohol use: Yes    Comment: OCC   Drug use: Never   Sexual activity: Not Currently    Partners: Male    Comment: 1st intercourse- 89, married- 89 yrs  Other Topics Concern   Not on file  Social History Narrative   Not on file   Social Determinants of Health   Financial Resource Strain: Not on file  Food Insecurity: Not on file  Transportation Needs: Not on file  Physical Activity: Not on file  Stress: Not on file  Social Connections: Not on file  Intimate Partner Violence: Not on file     Allergies  Allergen Reactions   Bactrim [Sulfamethoxazole-Trimethoprim] Rash    Chest tightness   Glimepiride Rash   Jardiance [Empagliflozin] Other (See Comments)   Metformin And Related Diarrhea      Outpatient Medications Prior to Visit  Medication Sig Dispense Refill   albuterol (PROVENTIL HFA;VENTOLIN HFA) 108 (90 Base) MCG/ACT inhaler Inhale 1-2 puffs into the lungs every 6 (six) hours as needed for wheezing or shortness of breath.     albuterol (PROVENTIL) (2.5 MG/3ML) 0.083% nebulizer solution Take 2.5 mg by nebulization every 6 (six) hours as needed for wheezing or shortness of breath.     atorvastatin (LIPITOR) 10 MG tablet Take 10 mg by mouth daily.  3   bimatoprost (LUMIGAN) 0.01 % SOLN 1 drop at bedtime.     brimonidine-timolol (COMBIGAN) 0.2-0.5 % ophthalmic solution Place 1 drop into both eyes every 12 (twelve) hours.     fluticasone (FLONASE) 50 MCG/ACT nasal spray Place 1 spray into both nostrils daily. 16 g 11   fluticasone furoate-vilanterol (BREO ELLIPTA) 100-25 MCG/ACT AEPB Inhale 1 puff into the lungs daily. 60 each 11   levothyroxine (SYNTHROID, LEVOTHROID) 50 MCG tablet Take 50 mcg by mouth daily before breakfast.     MELATONIN PO Take 1 tablet by mouth at bedtime as needed (sleep).     montelukast (SINGULAIR) 10 MG tablet TAKE 1 TABLET BY MOUTH EVERYDAY AT BEDTIME 90 tablet 3   multivitamin-lutein (OCUVITE-LUTEIN) CAPS capsule Take 1 capsule by mouth daily.     NONFORMULARY OR COMPOUNDED ITEM Biestrogen 60/40 0.375mg /ml cream Apply 6 clicks (0000000) to inner thigh or forearm daily Disp: 30 gram, Refills: 11 30 each 11   NONFORMULARY OR COMPOUNDED ITEM Progesterone 250mg  capsule Take one capsule daily at bedtime Disp: 90 capsules, Refills: 3 90 each 3   PRESCRIPTION MEDICATION 1 each daily.     Spacer/Aero-Holding Chambers (AEROCHAMBER MV) inhaler Use as instructed 1 each 5   traZODone (DESYREL) 150 MG tablet Take 150 mg by mouth at bedtime.     triamcinolone (KENALOG) 0.025 % cream Apply 1 Application topically 2 (two) times daily. Apply to affected area(s) on face twice daily 30 g 0   TRULICITY 1.5 0000000 SOPN As directed     venlafaxine XR (EFFEXOR-XR) 150 MG  24 hr capsule Take 150 mg by mouth 2 (two) times daily.     No facility-administered medications prior to visit.       Objective:   Physical Exam:  General appearance: 72 y.o., female, NAD, conversant  Eyes: anicteric sclerae; PERRL, tracking appropriately HENT: NCAT; MMM Neck: Trachea midline; no lymphadenopathy, no JVD Lungs: CTAB, no crackles, no wheeze, with normal respiratory effort CV: RRR, no murmur  Abdomen: Soft, non-tender; non-distended, BS present  Extremities: No peripheral edema, warm Skin: Normal turgor and texture; no rash Psych: Appropriate affect Neuro: Alert and oriented to person and place, no focal deficit     There were no vitals filed for this visit.        on RA BMI Readings from Last 3 Encounters:  03/06/22 48.68 kg/m  12/05/21 48.27  kg/m  10/25/21 48.60 kg/m   Wt Readings from Last 3 Encounters:  03/06/22 241 lb (109.3 kg)  12/05/21 239 lb (108.4 kg)  10/25/21 240 lb 9.6 oz (109.1 kg)     CBC    Component Value Date/Time   WBC 12.3 (H) 02/13/2021 1545   RBC 4.54 02/13/2021 1545   HGB 12.5 02/13/2021 1545   HCT 37.8 02/13/2021 1545   PLT 347 02/13/2021 1545   MCV 83.3 02/13/2021 1545   MCH 27.5 02/13/2021 1545   MCHC 33.1 02/13/2021 1545   RDW 16.0 (H) 02/13/2021 1545     Chest Imaging: CXR 02/13/21 with ?RML mucoid impaction  Pulmonary Functions Testing Results:    Latest Ref Rng & Units 04/10/2021   12:37 PM 03/22/2021   10:45 AM  PFT Results  FVC-Pre L 1.94  1.96   FVC-Predicted Pre % 79  80   FVC-Post L 1.89  1.96   FVC-Predicted Post % 77  80   Pre FEV1/FVC % % 81  83   Post FEV1/FCV % % 82  84   FEV1-Pre L 1.58  1.62   FEV1-Predicted Pre % 85  88   FEV1-Post L 1.55  1.65   DLCO uncorrected ml/min/mmHg  15.11   DLCO UNC% %  92   DLCO corrected ml/min/mmHg  15.56   DLCO COR %Predicted %  95   DLVA Predicted %  123   TLC L  3.67   TLC % Predicted %  85   RV % Predicted %  74    03/22/21: reviewed by me,  normal 04/10/21: meth challenge was borderline AHR with drop in FEV1 below 80% from baseline only after 16 mg/mL     Assessment & Plan:   # Cough # Dyspnea on exertion # Intermittent asthma  Could be consistent with mild intermittent asthma, does have borderline AHR on methacholine challenge 04/10/21 despite remaining on singulair. Triggers/exacerbating factors include rhinitis/postnasal drainage, season change (fall especially). Doing well with intermittent to daily low dose breo, occasional twice daily use  # Daytime sleepiness # History of OSA Thyroid ok per pt. Has a leaky CPAP mask (95% leak is 55L!) on review of download today but AHI is ok still (~3-4). Saw Dr. Maxwell Caul in past for this. [employer] is her DME supplier. Resmed machine - she thinks autopap 5-15. Has tried mask liner.   Plan: - take shower, clear nose of crusting and then use flonase 1 spray each nostril for at least 8 weeks to knock out postnasal drainage. Can also add OTC antihistamine like claritin or allegra if this is insufficient. - Can use breo on as needed basis.  If you know you're gonna be outside a lot during difficult seasons like fall, spring then use your breo 1 puff 30 minutes before you go out. If breathing substantially worse then go back to using it 1 puff once daily, can even increase to 2 puffs daily for a few days - if this doesn't take care of it then give [place] a call or send message - keep using singulair 10 mg daily - call [employer] and ask for new nasal pillow mask (small) with headgear. If they give you any flak about it then let us know and we'll place order.  - see you in 6 months or sooner if need be       Maryjane Hurter, MD [facility] 09/16/2022 7:21 PM

## 2022-09-17 ENCOUNTER — Ambulatory Visit: Payer: Medicare Other | Admitting: Student

## 2022-09-17 ENCOUNTER — Encounter: Payer: Self-pay | Admitting: Student

## 2022-09-17 VITALS — BP 120/72 | HR 122 | Temp 98.2°F | Ht 59.0 in | Wt 238.0 lb

## 2022-09-17 DIAGNOSIS — J4520 Mild intermittent asthma, uncomplicated: Secondary | ICD-10-CM | POA: Diagnosis not present

## 2022-09-17 DIAGNOSIS — G4733 Obstructive sleep apnea (adult) (pediatric): Secondary | ICD-10-CM | POA: Diagnosis not present

## 2022-09-17 NOTE — Patient Instructions (Addendum)
-   take shower, clear nose of crusting and then use flonase 1 spray each nostril for at least 8 weeks to knock out postnasal drainage. Can also add OTC antihistamine like claritin or allegra if this is insufficient. - Can use breo on as needed basis.  If you know you're gonna be outside a lot during difficult seasons like fall, spring then use your breo 1 puff 30 minutes before you go out. If breathing substantially worse then go back to using it 1 puff once daily, can even increase to 2 puffs daily for a few days - if this doesn't take care of it then give [place] a call or send message - keep using singulair 10 mg daily - call [employer] and ask for new nasal pillow mask (small) with headgear. If they give you any flak about it then let us know and we'll place order.  - see you in 6 months or sooner if need be

## 2022-09-18 DIAGNOSIS — K080 Exfoliation of teeth due to systemic causes: Secondary | ICD-10-CM | POA: Diagnosis not present

## 2022-09-19 ENCOUNTER — Telehealth: Payer: Self-pay | Admitting: Student

## 2022-09-19 NOTE — Telephone Encounter (Signed)
Sure! If cough, postnasal drainage, sinus congestion and/or shortness of breath with exertion worsen then would resume it

## 2022-09-19 NOTE — Telephone Encounter (Signed)
Called and spoke to patient and she is wondering if she could come off of the singular. I told her that should be ok. But she is wanting to make sure it is ok from the doctor.   Dr Verlee Monte are you ok with patient stopping singular  Please advise

## 2022-09-19 NOTE — Telephone Encounter (Signed)
Advised patient she can stop using singular unless allergy symptoms resume. Advised to call office is she ever needs this refilled. She verbalized understanding. Nothing further needed.

## 2022-09-24 DIAGNOSIS — F3341 Major depressive disorder, recurrent, in partial remission: Secondary | ICD-10-CM | POA: Diagnosis not present

## 2022-10-16 DIAGNOSIS — K080 Exfoliation of teeth due to systemic causes: Secondary | ICD-10-CM | POA: Diagnosis not present

## 2022-10-23 DIAGNOSIS — E119 Type 2 diabetes mellitus without complications: Secondary | ICD-10-CM | POA: Diagnosis not present

## 2022-10-23 DIAGNOSIS — H10413 Chronic giant papillary conjunctivitis, bilateral: Secondary | ICD-10-CM | POA: Diagnosis not present

## 2022-10-23 DIAGNOSIS — H0102A Squamous blepharitis right eye, upper and lower eyelids: Secondary | ICD-10-CM | POA: Diagnosis not present

## 2022-10-23 DIAGNOSIS — H401131 Primary open-angle glaucoma, bilateral, mild stage: Secondary | ICD-10-CM | POA: Diagnosis not present

## 2022-11-05 ENCOUNTER — Other Ambulatory Visit: Payer: Self-pay | Admitting: Internal Medicine

## 2022-11-05 DIAGNOSIS — K080 Exfoliation of teeth due to systemic causes: Secondary | ICD-10-CM | POA: Diagnosis not present

## 2022-11-05 DIAGNOSIS — E1169 Type 2 diabetes mellitus with other specified complication: Secondary | ICD-10-CM | POA: Diagnosis not present

## 2022-11-05 DIAGNOSIS — E119 Type 2 diabetes mellitus without complications: Secondary | ICD-10-CM | POA: Diagnosis not present

## 2022-11-05 DIAGNOSIS — Z1231 Encounter for screening mammogram for malignant neoplasm of breast: Secondary | ICD-10-CM

## 2022-11-05 DIAGNOSIS — F331 Major depressive disorder, recurrent, moderate: Secondary | ICD-10-CM | POA: Diagnosis not present

## 2022-11-05 DIAGNOSIS — I700 Atherosclerosis of aorta: Secondary | ICD-10-CM | POA: Diagnosis not present

## 2022-11-13 ENCOUNTER — Ambulatory Visit: Payer: Medicare Other

## 2022-11-19 ENCOUNTER — Ambulatory Visit
Admission: RE | Admit: 2022-11-19 | Discharge: 2022-11-19 | Disposition: A | Discharge status: Home / Self Care | Payer: Medicare Other | Source: Ambulatory Visit | Attending: Internal Medicine | Admitting: Internal Medicine

## 2022-11-19 DIAGNOSIS — Z1231 Encounter for screening mammogram for malignant neoplasm of breast: Secondary | ICD-10-CM

## 2022-11-22 ENCOUNTER — Other Ambulatory Visit: Payer: Self-pay | Admitting: Internal Medicine

## 2022-11-22 DIAGNOSIS — R928 Other abnormal and inconclusive findings on diagnostic imaging of breast: Secondary | ICD-10-CM

## 2022-12-06 ENCOUNTER — Ambulatory Visit
Admission: RE | Admit: 2022-12-06 | Discharge: 2022-12-06 | Disposition: A | Discharge status: Home / Self Care | Payer: Medicare Other | Source: Ambulatory Visit | Attending: Internal Medicine | Admitting: Internal Medicine

## 2022-12-06 ENCOUNTER — Other Ambulatory Visit: Payer: Self-pay | Admitting: Internal Medicine

## 2022-12-06 DIAGNOSIS — R922 Inconclusive mammogram: Secondary | ICD-10-CM | POA: Diagnosis not present

## 2022-12-06 DIAGNOSIS — R928 Other abnormal and inconclusive findings on diagnostic imaging of breast: Secondary | ICD-10-CM

## 2022-12-06 DIAGNOSIS — N63 Unspecified lump in unspecified breast: Secondary | ICD-10-CM | POA: Diagnosis not present

## 2022-12-06 DIAGNOSIS — N6310 Unspecified lump in the right breast, unspecified quadrant: Secondary | ICD-10-CM

## 2022-12-10 ENCOUNTER — Ambulatory Visit
Admission: RE | Admit: 2022-12-10 | Discharge: 2022-12-10 | Disposition: A | Discharge status: Home / Self Care | Payer: Medicare Other | Source: Ambulatory Visit | Attending: Internal Medicine | Admitting: Internal Medicine

## 2022-12-10 DIAGNOSIS — N6310 Unspecified lump in the right breast, unspecified quadrant: Secondary | ICD-10-CM

## 2022-12-10 DIAGNOSIS — K080 Exfoliation of teeth due to systemic causes: Secondary | ICD-10-CM | POA: Diagnosis not present

## 2022-12-10 DIAGNOSIS — C50411 Malignant neoplasm of upper-outer quadrant of right female breast: Secondary | ICD-10-CM | POA: Diagnosis not present

## 2022-12-10 DIAGNOSIS — R928 Other abnormal and inconclusive findings on diagnostic imaging of breast: Secondary | ICD-10-CM

## 2022-12-10 DIAGNOSIS — N6311 Unspecified lump in the right breast, upper outer quadrant: Secondary | ICD-10-CM | POA: Diagnosis not present

## 2022-12-10 HISTORY — PX: BREAST BIOPSY: SHX20

## 2022-12-12 ENCOUNTER — Telehealth: Payer: Self-pay | Admitting: Hematology and Oncology

## 2022-12-12 NOTE — Telephone Encounter (Signed)
Spoke to patient to confirm upcoming afternoon [facility] clinic appointment on 6/19, paperwork will be sent via mail.   Gave location and time, also informed patient that the surgeon's office would be calling as well to get information from them similar to the packet that they will be receiving so make sure to do both.  Reminded patient that all providers will be coming to the clinic to see them HERE and if they had any questions to not hesitate to reach back out to myself or their navigators.

## 2022-12-16 ENCOUNTER — Encounter: Payer: Self-pay | Admitting: *Deleted

## 2022-12-16 DIAGNOSIS — Z170 Estrogen receptor positive status [ER+]: Secondary | ICD-10-CM

## 2022-12-17 NOTE — Progress Notes (Signed)
Radiation Oncology         [phone] ________________________________  Initial Outpatient Consultation  Name: Kayla Ray MRN: [id number]  Date: 12/18/2022  DOB: Sep 18, 1950  WG:[id number], Jerelyn Scott, MD  Griselda Miner, MD   REFERRING PHYSICIAN: [name], MD  DIAGNOSIS: No diagnosis found.   Cancer Staging  No matching staging information was found for the patient.  Stage *** Right Breast UOQ, Invasive ductal carcinoma with intermediate grade DCIS, ER+ / PR+ / Her2-, Grade 1  CHIEF COMPLAINT: Here to discuss management of right breast cancer  HISTORY OF PRESENT ILLNESS::Kayla Ray is a 72 y.o. female who presented with a right breast abnormality on the following imaging: bilateral screening mammogram on the date of 11/19/22. No symptoms, if any, were reported at that time. Diagnostic right breast mammogram and right breast ultrasound on 12/06/22 demonstrated an approximately 7 mm mass in the 9-10 o'clock right breast, located 6 cmfn. No evidence of right axillary lymphadenopathy was appreciated.       Biopsy of the 10 o'clock right breast on date of 12/10/22 showed grade 1 invasive ductal carcinoma measuring 6 mm in the greatest linear extent of the sample with intermediate grade DCIS.  ER status: 95% positive and PR status 95% positive, both with strong staining intensity; Proliferation marker Ki67 at 5%; Her2 status negative; Grade 1. No lymph nodes were examined.   ***  PREVIOUS RADIATION THERAPY: No  PAST MEDICAL HISTORY:  has a past medical history of Asthma and Diabetes mellitus without complication (HCC).    PAST SURGICAL HISTORY: Past Surgical History:  Procedure Laterality Date   BREAST BIOPSY Right 2019   BREAST BIOPSY Right 12/10/2022   [place] RT BREAST BX W LOC DEV 1ST LESION IMG BX SPEC US GUIDE 12/10/2022 [facility]MOGRAP[facility]   CATARACT EXTRACTION     CESAREAN SECTION     x2   GALLBLADDER SURGERY     VAGINAL HYSTERECTOMY  1984    FAMILY HISTORY: family  history includes Cancer in her sister; Diabetes in her maternal grandmother and mother; Heart disease in her mother and sister.  SOCIAL HISTORY:  reports that she has never smoked. She has never used smokeless tobacco. She reports current alcohol use. She reports that she does not use drugs.  ALLERGIES: Bactrim [sulfamethoxazole-trimethoprim], Glimepiride, Jardiance [empagliflozin], and Metformin and related  MEDICATIONS:  Current Outpatient Medications  Medication Sig Dispense Refill   albuterol (PROVENTIL HFA;VENTOLIN HFA) 108 (90 Base) MCG/ACT inhaler Inhale 1-2 puffs into the lungs every 6 (six) hours as needed for wheezing or shortness of breath.     albuterol (PROVENTIL) (2.5 MG/3ML) 0.083% nebulizer solution Take 2.5 mg by nebulization every 6 (six) hours as needed for wheezing or shortness of breath.     atorvastatin (LIPITOR) 10 MG tablet Take 10 mg by mouth daily. (Patient not taking: Reported on 09/17/2022)  3   bimatoprost (LUMIGAN) 0.01 % SOLN 1 drop at bedtime.     brimonidine-timolol (COMBIGAN) 0.2-0.5 % ophthalmic solution Place 1 drop into both eyes every 12 (twelve) hours.     fluticasone (FLONASE) 50 MCG/ACT nasal spray Place 1 spray into both nostrils daily. 16 g 11   fluticasone furoate-vilanterol (BREO ELLIPTA) 100-25 MCG/ACT AEPB Inhale 1 puff into the lungs daily. 60 each 11   levothyroxine (SYNTHROID, LEVOTHROID) 50 MCG tablet Take 50 mcg by mouth daily before breakfast.     montelukast (SINGULAIR) 10 MG tablet TAKE 1 TABLET BY MOUTH EVERYDAY AT BEDTIME 90 tablet 3   multivitamin-lutein (  OCUVITE-LUTEIN) CAPS capsule Take 1 capsule by mouth daily.     NONFORMULARY OR COMPOUNDED ITEM Biestrogen 60/40 0.375mg /ml cream Apply 6 clicks (0.57ml) to inner thigh or forearm daily Disp: 30 gram, Refills: 11 30 each 11   NONFORMULARY OR COMPOUNDED ITEM Progesterone 250mg  capsule Take one capsule daily at bedtime Disp: 90 capsules, Refills: 3 90 each 3   PRESCRIPTION MEDICATION 1  each daily.     traZODone (DESYREL) 150 MG tablet Take 150 mg by mouth at bedtime.     TRULICITY 1.5 MG/0.5ML SOPN As directed     venlafaxine XR (EFFEXOR-XR) 150 MG 24 hr capsule Take 150 mg by mouth 2 (two) times daily.     No current facility-administered medications for this encounter.    REVIEW OF SYSTEMS: As above in HPI.   PHYSICAL EXAM:  vitals were not taken for this visit.   General: Alert and oriented, in no acute distress HEENT: Head is normocephalic. Extraocular movements are intact. Oropharynx is clear. Neck: Neck is supple, no palpable cervical or supraclavicular lymphadenopathy. Heart: Regular in rate and rhythm with no murmurs, rubs, or gallops. Chest: Clear to auscultation bilaterally, with no rhonchi, wheezes, or rales. Abdomen: Soft, nontender, nondistended, with no rigidity or guarding. Extremities: No cyanosis or edema. Lymphatics: see Neck Exam Skin: No concerning lesions. Musculoskeletal: symmetric strength and muscle tone throughout. Neurologic: Cranial nerves II through XII are grossly intact. No obvious focalities. Speech is fluent. Coordination is intact. Psychiatric: Judgment and insight are intact. Affect is appropriate. Breasts: *** . No other palpable masses appreciated in the breasts or axillae *** .    ECOG = ***  0 - Asymptomatic (Fully active, able to carry on all predisease activities without restriction)  1 - Symptomatic but completely ambulatory (Restricted in physically strenuous activity but ambulatory and able to carry out work of a light or sedentary nature. For example, light housework, office work)  2 - Symptomatic, <50% in bed during the day (Ambulatory and capable of all self care but unable to carry out any work activities. Up and about more than 50% of waking hours)  3 - Symptomatic, >50% in bed, but not bedbound (Capable of only limited self-care, confined to bed or chair 50% or more of waking hours)  4 - Bedbound (Completely  disabled. Cannot carry on any self-care. Totally confined to bed or chair)  5 - Death   Santiago Glad MM, Creech RH, Tormey DC, et al. [id number]). "Toxicity and response criteria of the [facility]". Am. Evlyn Clines. Oncol. 5 (6): 649-55   LABORATORY DATA:  Lab Results  Component Value Date   WBC 12.3 (H) 02/13/2021   HGB 12.5 02/13/2021   HCT 37.8 02/13/2021   MCV 83.3 02/13/2021   PLT 347 02/13/2021   CMP     Component Value Date/Time   NA 132 (L) 02/13/2021 1545   K 3.8 02/13/2021 1545   CL 95 (L) 02/13/2021 1545   CO2 25 02/13/2021 1545   GLUCOSE 107 (H) 02/13/2021 1545   BUN 7 (L) 02/13/2021 1545   CREATININE 0.62 02/13/2021 1545   CALCIUM 9.5 02/13/2021 1545   GFRNONAA >60 02/13/2021 1545   GFRAA >60 12/03/2017 2302         RADIOGRAPHY: [place] RT BREAST BX W LOC DEV 1ST LESION IMG BX SPEC US GUIDE  Addendum Date: 12/11/2022   ADDENDUM REPORT: 12/11/2022 11:57 ADDENDUM: PATHOLOGY revealed: Site Breast, RIGHT, needle core biopsy, 10 o'clock, 6 cm fn INVASIVE DUCTAL CARCINOMA DUCTAL  CARCINOMA IN SITU, CRIBRIFORM TYPE, INTERMEDIATE NUCLEAR GRADE, WITH CALCIFICATIONS TUBULE FORMATION: SCORE 2 NUCLEAR PLEOMORPHISM: SCORE 2 MITOTIC COUNT: SCORE 1 TOTAL SCORE: 5 OVERALL GRADE: 1 LYMPHOVASCULAR INVASION: NOT IDENTIFIED CANCER LENGTH: 0.6 CM CALCIFICATIONS: NOT IDENTIFIED OTHER FINDINGS: SEE NOTE Pathology results are CONCORDANT with imaging findings, per Dr. Sande Brothers. Pathology results and recommendations below were discussed with patient by telephone on 12/11/2022. Patient reported biopsy site within normal limits with slight tenderness at the site. Post biopsy care instructions were reviewed, questions were answered and my direct phone number was provided to patient. Patient was instructed to call [facility] if any concerns or questions arise related to the biopsy. The patient was referred to the [facility] at [facility] with appointment on 12/25/2022. Pathology results reported by Lynett Grimes, RN on 12/11/2022. Electronically Signed   By: Sande Brothers M.D.   On: 12/11/2022 11:57   Result Date: 12/11/2022 CLINICAL DATA:  Suspicious right breast mass. EXAM: ULTRASOUND GUIDED RIGHT BREAST CORE NEEDLE BIOPSY COMPARISON:  Previous exam(s). PROCEDURE: I met with the patient and we discussed the procedure of ultrasound-guided biopsy, including benefits and alternatives. We discussed the high likelihood of a successful procedure. We discussed the risks of the procedure, including infection, bleeding, tissue injury, clip migration, and inadequate sampling. Informed written consent was given. The usual time-out protocol was performed immediately prior to the procedure. Lesion quadrant: Upper outer quadrant Using sterile technique and 1% Lidocaine as local anesthetic, under direct ultrasound visualization, a 14 gauge spring-loaded device was used to perform biopsy of an irregular mass at the 10 o'clock position of the right breast using a lateral approach. At the conclusion of the procedure a venus shaped tissue marker clip was deployed into the biopsy cavity. Follow up 2 view mammogram was performed and dictated separately. IMPRESSION: Ultrasound guided biopsy of the upper outer right breast. No apparent complications. Electronically Signed: By: Sande Brothers M.D. On: 12/10/2022 10:08  MM CLIP PLACEMENT RIGHT  Result Date: 12/10/2022 CLINICAL DATA:  Status post right breast biopsy. EXAM: 3D DIAGNOSTIC RIGHT MAMMOGRAM POST ULTRASOUND BIOPSY COMPARISON:  Previous exam(s). FINDINGS: 3D Mammographic images were obtained following ultrasound guided biopsy of the upper outer right breast. The biopsy marking clip is in expected position at the site of biopsy. IMPRESSION: Appropriate positioning of the venus shaped biopsy marking clip at the site of biopsy in the upper-outer right breast. Final Assessment: Post  Procedure Mammograms for Marker Placement Electronically Signed   By: Sande Brothers M.D.   On: 12/10/2022 10:23  MM 3D DIAGNOSTIC MAMMOGRAM UNILATERAL RIGHT BREAST  Result Date: 12/06/2022 CLINICAL DATA:  Patient returns today to evaluate a possible RIGHT breast mass questioned on recent screening mammogram. EXAM: DIGITAL DIAGNOSTIC UNILATERAL RIGHT MAMMOGRAM WITH TOMOSYNTHESIS; ULTRASOUND RIGHT BREAST LIMITED TECHNIQUE: Right digital diagnostic mammography and breast tomosynthesis was performed.; Targeted ultrasound examination of the right breast was performed COMPARISON:  Previous exams including recent screening mammogram dated 11/19/2022 and stereotactic biopsy dated 06/17/2018. ACR Breast Density Category b: There are scattered areas of fibroglandular density. FINDINGS: On today's additional diagnostic views, an irregular dense mass is confirmed within the upper-outer quadrant of the RIGHT breast, 9-10 o'clock axis region, measuring approximately 7 mm greatest dimension. Targeted ultrasound is performed, showing an irregular hypoechoic mass in the RIGHT breast at the 10 o'clock axis, 6 cm from the nipple, with indistinct margins, with anti parallel orientation, without internal vascularity, corresponding to the mammographic finding.  RIGHT axilla was evaluated with ultrasound showing no enlarged or morphologically abnormal lymph nodes. IMPRESSION: Irregular hypoechoic mass in the RIGHT breast at the 10 o'clock axis, 6 cm from the nipple, corresponding to the mammographic finding. This is a suspicious finding for which ultrasound-guided biopsy is recommended. RECOMMENDATION: Ultrasound-guided biopsy for the RIGHT breast mass at the 10 o'clock axis. Ultrasound-guided biopsy will be scheduled at patient's convenience. I have discussed the findings and recommendations with the patient. If applicable, a reminder letter will be sent to the patient regarding the next appointment. BI-RADS CATEGORY  4: Suspicious.  Electronically Signed   By: Bary Richard M.D.   On: 12/06/2022 10:04  [place] LIMITED ULTRASOUND INCLUDING AXILLA RIGHT BREAST  Result Date: 12/06/2022 CLINICAL DATA:  Patient returns today to evaluate a possible RIGHT breast mass questioned on recent screening mammogram. EXAM: DIGITAL DIAGNOSTIC UNILATERAL RIGHT MAMMOGRAM WITH TOMOSYNTHESIS; ULTRASOUND RIGHT BREAST LIMITED TECHNIQUE: Right digital diagnostic mammography and breast tomosynthesis was performed.; Targeted ultrasound examination of the right breast was performed COMPARISON:  Previous exams including recent screening mammogram dated 11/19/2022 and stereotactic biopsy dated 06/17/2018. ACR Breast Density Category b: There are scattered areas of fibroglandular density. FINDINGS: On today's additional diagnostic views, an irregular dense mass is confirmed within the upper-outer quadrant of the RIGHT breast, 9-10 o'clock axis region, measuring approximately 7 mm greatest dimension. Targeted ultrasound is performed, showing an irregular hypoechoic mass in the RIGHT breast at the 10 o'clock axis, 6 cm from the nipple, with indistinct margins, with anti parallel orientation, without internal vascularity, corresponding to the mammographic finding. RIGHT axilla was evaluated with ultrasound showing no enlarged or morphologically abnormal lymph nodes. IMPRESSION: Irregular hypoechoic mass in the RIGHT breast at the 10 o'clock axis, 6 cm from the nipple, corresponding to the mammographic finding. This is a suspicious finding for which ultrasound-guided biopsy is recommended. RECOMMENDATION: Ultrasound-guided biopsy for the RIGHT breast mass at the 10 o'clock axis. Ultrasound-guided biopsy will be scheduled at patient's convenience. I have discussed the findings and recommendations with the patient. If applicable, a reminder letter will be sent to the patient regarding the next appointment. BI-RADS CATEGORY  4: Suspicious. Electronically Signed   By: Bary Richard  M.D.   On: 12/06/2022 10:04  MM 3D SCREENING MAMMOGRAM BILATERAL BREAST  Result Date: 11/21/2022 CLINICAL DATA:  Screening. EXAM: DIGITAL SCREENING BILATERAL MAMMOGRAM WITH TOMOSYNTHESIS AND CAD TECHNIQUE: Bilateral screening digital craniocaudal and mediolateral oblique mammograms were obtained. Bilateral screening digital breast tomosynthesis was performed. The images were evaluated with computer-aided detection. COMPARISON:  Previous exam(s). ACR Breast Density Category b: There are scattered areas of fibroglandular density. FINDINGS: In the right breast, a possible mass warrants further evaluation. In the left breast, no findings suspicious for malignancy. IMPRESSION: Further evaluation is suggested for a possible mass in the right breast. RECOMMENDATION: Diagnostic mammogram and possibly ultrasound of the right breast. (Code:[id number]) The patient will be contacted regarding the findings, and additional imaging will be scheduled. BI-RADS CATEGORY  0: Incomplete: Need additional imaging evaluation. Electronically Signed   By: Emmaline Kluver M.D.   On: 11/21/2022 13:56      IMPRESSION/PLAN: ***   It was a pleasure meeting the patient today. We discussed the risks, benefits, and side effects of radiotherapy. I recommend radiotherapy to the *** to reduce her risk of locoregional recurrence by 2/3.  We discussed that radiation would take approximately *** weeks to complete and that I would give the patient a few weeks to heal following surgery before starting  treatment planning. *** If chemotherapy were to be given, this would precede radiotherapy. We spoke about acute effects including skin irritation and fatigue as well as much less common late effects including internal organ injury or irritation. We spoke about the latest technology that is used to minimize the risk of late effects for patients undergoing radiotherapy to the breast or chest wall. No guarantees of treatment were given. The patient is  enthusiastic about proceeding with treatment. I look forward to participating in the patient's care.  I will await her referral back to me for postoperative follow-up and eventual CT simulation/treatment planning.  On date of service, in total, I spent *** minutes on this encounter. Patient was seen in person.   __________________________________________   Lonie Peak, MD  This document serves as a record of services personally performed by Lonie Peak, MD. It was created on her behalf by Neena Rhymes, a trained medical scribe. The creation of this record is based on the scribe's personal observations and the provider's statements to them. This document has been checked and approved by the attending provider.

## 2022-12-18 ENCOUNTER — Other Ambulatory Visit: Payer: Self-pay

## 2022-12-18 ENCOUNTER — Encounter: Payer: Self-pay | Admitting: Hematology and Oncology

## 2022-12-18 ENCOUNTER — Inpatient Hospital Stay (HOSPITAL_BASED_OUTPATIENT_CLINIC_OR_DEPARTMENT_OTHER): Payer: Medicare Other | Admitting: Hematology and Oncology

## 2022-12-18 ENCOUNTER — Ambulatory Visit: Payer: Medicare Other | Admitting: Physical Therapy

## 2022-12-18 ENCOUNTER — Ambulatory Visit: Payer: Self-pay | Admitting: General Surgery

## 2022-12-18 ENCOUNTER — Inpatient Hospital Stay (HOSPITAL_BASED_OUTPATIENT_CLINIC_OR_DEPARTMENT_OTHER): Payer: Medicare Other | Admitting: Genetic Counselor

## 2022-12-18 ENCOUNTER — Ambulatory Visit
Admission: RE | Admit: 2022-12-18 | Discharge: 2022-12-18 | Disposition: A | Discharge status: Home / Self Care | Payer: Medicare Other | Source: Ambulatory Visit | Attending: Radiation Oncology | Admitting: Radiation Oncology

## 2022-12-18 ENCOUNTER — Inpatient Hospital Stay: Payer: Medicare Other | Attending: Hematology and Oncology

## 2022-12-18 ENCOUNTER — Encounter: Payer: Self-pay | Admitting: Radiation Oncology

## 2022-12-18 VITALS — BP 148/61 | HR 88 | Temp 97.8°F | Resp 18 | Ht 59.0 in | Wt 239.4 lb

## 2022-12-18 DIAGNOSIS — Z833 Family history of diabetes mellitus: Secondary | ICD-10-CM

## 2022-12-18 DIAGNOSIS — Z90710 Acquired absence of both cervix and uterus: Secondary | ICD-10-CM

## 2022-12-18 DIAGNOSIS — Z881 Allergy status to other antibiotic agents status: Secondary | ICD-10-CM | POA: Diagnosis not present

## 2022-12-18 DIAGNOSIS — J45909 Unspecified asthma, uncomplicated: Secondary | ICD-10-CM | POA: Insufficient documentation

## 2022-12-18 DIAGNOSIS — Z807 Family history of other malignant neoplasms of lymphoid, hematopoietic and related tissues: Secondary | ICD-10-CM | POA: Diagnosis not present

## 2022-12-18 DIAGNOSIS — C50411 Malignant neoplasm of upper-outer quadrant of right female breast: Secondary | ICD-10-CM | POA: Insufficient documentation

## 2022-12-18 DIAGNOSIS — Z170 Estrogen receptor positive status [ER+]: Secondary | ICD-10-CM

## 2022-12-18 DIAGNOSIS — Z8249 Family history of ischemic heart disease and other diseases of the circulatory system: Secondary | ICD-10-CM | POA: Diagnosis not present

## 2022-12-18 DIAGNOSIS — Z79899 Other long term (current) drug therapy: Secondary | ICD-10-CM | POA: Diagnosis not present

## 2022-12-18 DIAGNOSIS — Z8481 Family history of carrier of genetic disease: Secondary | ICD-10-CM

## 2022-12-18 LAB — CBC WITH DIFFERENTIAL (CANCER CENTER ONLY)
Abs Immature Granulocytes: 0.04 10*3/uL (ref 0.00–0.07)
Basophils Absolute: 0.1 10*3/uL (ref 0.0–0.1)
Basophils Relative: 1 %
Eosinophils Absolute: 0.3 10*3/uL (ref 0.0–0.5)
Eosinophils Relative: 3 %
HCT: 36.6 % (ref 36.0–46.0)
Hemoglobin: 11.6 g/dL — ABNORMAL LOW (ref 12.0–15.0)
Immature Granulocytes: 0 %
Lymphocytes Relative: 28 %
Lymphs Abs: 2.9 10*3/uL (ref 0.7–4.0)
MCH: 27.6 pg (ref 26.0–34.0)
MCHC: 31.7 g/dL (ref 30.0–36.0)
MCV: 86.9 fL (ref 80.0–100.0)
Monocytes Absolute: 1.3 10*3/uL — ABNORMAL HIGH (ref 0.1–1.0)
Monocytes Relative: 12 %
Neutro Abs: 5.8 10*3/uL (ref 1.7–7.7)
Neutrophils Relative %: 56 %
Platelet Count: 294 10*3/uL (ref 150–400)
RBC: 4.21 MIL/uL (ref 3.87–5.11)
RDW: 14.4 % (ref 11.5–15.5)
WBC Count: 10.3 10*3/uL (ref 4.0–10.5)
nRBC: 0 % (ref 0.0–0.2)

## 2022-12-18 LAB — CMP (CANCER CENTER ONLY)
ALT: 18 U/L (ref 0–44)
AST: 20 U/L (ref 15–41)
Albumin: 3.9 g/dL (ref 3.5–5.0)
Alkaline Phosphatase: 77 U/L (ref 38–126)
Anion gap: 5 (ref 5–15)
BUN: 14 mg/dL (ref 8–23)
CO2: 28 mmol/L (ref 22–32)
Calcium: 9.4 mg/dL (ref 8.9–10.3)
Chloride: 103 mmol/L (ref 98–111)
Creatinine: 0.72 mg/dL (ref 0.44–1.00)
GFR, Estimated: >60 mL/min (ref >60)
Glucose, Bld: 173 mg/dL — ABNORMAL HIGH (ref 70–99)
Potassium: 4.2 mmol/L (ref 3.5–5.1)
Sodium: 136 mmol/L (ref 135–145)
Total Bilirubin: 0.3 mg/dL (ref 0.3–1.2)
Total Protein: 6.3 g/dL — ABNORMAL LOW (ref 6.5–8.1)

## 2022-12-18 LAB — GENETIC SCREENING ORDER

## 2022-12-18 MED ORDER — KETOROLAC TROMETHAMINE 15 MG/ML IJ SOLN: 15.0000 mg | Freq: Once | INTRAMUSCULAR | Status: AC

## 2022-12-18 NOTE — Progress Notes (Signed)
[facility] CONSULT NOTE  Patient Care Team: Georgann Housekeeper, MD as PCP - General (Internal Medicine) Serena Croissant, MD as Consulting Physician (Hematology and Oncology) Griselda Miner, MD as Consulting Physician (General Surgery) Lonie Peak, MD as Attending Physician (Radiation Oncology) Donnelly Angelica, RN as Oncology Nurse Navigator Pershing Proud, RN as Oncology Nurse Navigator  CHIEF COMPLAINTS/PURPOSE OF CONSULTATION:  Newly diagnosed breast cancer  HISTORY OF PRESENTING ILLNESS:  Kayla Ray 72 y.o. female is here because of recent diagnosis of right breast cancer.  Patient routine screening mammogram detected right breast mass upper outer quadrant 10 o'clock position measuring 0.7 cm.  Axilla was negative.  Ultrasound-guided biopsy revealed grade 1 invasive ductal carcinoma with DCIS that was ER/PR positive HER2 negative and a Ki-67 of 5%.  She was presented this morning to the multidisciplinary tumor board and she is here today accompanied by her family to discuss her treatment plan.  I reviewed her records extensively and collaborated the history with the patient.  SUMMARY OF ONCOLOGIC HISTORY: Oncology History  Malignant neoplasm of upper-outer quadrant of right breast in female, estrogen receptor positive (HCC)  12/10/2022 Initial Diagnosis   Screening mammogram detected right breast mass UOQ 10 o'clock position: 0.7 cm, axilla negative, ultrasound biopsy: Grade 1 IDC with DCIS, ER 95%, PR 95%, Ki-67 5%, HER2 2+ by IHC, FISH negative   12/18/2022 Cancer Staging   Staging form: Breast, AJCC 8th Edition - Clinical: Stage IA (cT1b, cN0, cM0, G1, ER+, PR+, HER2-) - Signed by Serena Croissant, MD on 12/18/2022 Histologic grading system: 3 grade system      MEDICAL HISTORY:  Past Medical History:  Diagnosis Date   Asthma    Breast cancer (HCC)    Diabetes mellitus without complication (HCC)     SURGICAL HISTORY: Past Surgical History:  Procedure  Laterality Date   BREAST BIOPSY Right 2019   BREAST BIOPSY Right 12/10/2022   [place] RT BREAST BX W LOC DEV 1ST LESION IMG BX SPEC US GUIDE 12/10/2022 [facility]MO[facility]   CATARACT EXTRACTION     CESAREAN SECTION     x2   GALLBLADDER SURGERY     VAGINAL HYSTERECTOMY  1984    SOCIAL HISTORY: Social History   Socioeconomic History   Marital status: Married    Spouse name: Not on file   Number of children: Not on file   Years of education: Not on file   Highest education level: Not on file  Occupational History   Not on file  Tobacco Use   Smoking status: Never   Smokeless tobacco: Never  Vaping Use   Vaping Use: Never used  Substance and Sexual Activity   Alcohol use: Yes    Comment: OCC   Drug use: Never   Sexual activity: Not Currently    Partners: Male    Comment: 1st intercourse- 41, married- 45 yrs   Other Topics Concern   Not on file  Social History Narrative   Not on file   Social Determinants of Health   Financial Resource Strain: Not on file  Food Insecurity: Not on file  Transportation Needs: Not on file  Physical Activity: Not on file  Stress: Not on file  Social Connections: Not on file  Intimate Partner Violence: Not on file    FAMILY HISTORY: Family History  Problem Relation Age of Onset   Diabetes Mother    Heart disease Mother    Cancer Sister        lymphoma  Heart disease Sister    Diabetes Maternal Grandmother     ALLERGIES:  is allergic to bactrim [sulfamethoxazole-trimethoprim], glimepiride, jardiance [empagliflozin], and metformin and related.  MEDICATIONS:  Current Outpatient Medications  Medication Sig Dispense Refill   albuterol (PROVENTIL HFA;VENTOLIN HFA) 108 (90 Base) MCG/ACT inhaler Inhale 1-2 puffs into the lungs every 6 (six) hours as needed for wheezing or shortness of breath.     albuterol (PROVENTIL) (2.5 MG/3ML) 0.083% nebulizer solution Take 2.5 mg by nebulization every 6 (six) hours as needed for wheezing or shortness of  breath.     atorvastatin (LIPITOR) 10 MG tablet Take 10 mg by mouth daily. (Patient not taking: Reported on 09/17/2022)  3   bimatoprost (LUMIGAN) 0.01 % SOLN 1 drop at bedtime.     brimonidine-timolol (COMBIGAN) 0.2-0.5 % ophthalmic solution Place 1 drop into both eyes every 12 (twelve) hours.     fluticasone (FLONASE) 50 MCG/ACT nasal spray Place 1 spray into both nostrils daily. 16 g 11   fluticasone furoate-vilanterol (BREO ELLIPTA) 100-25 MCG/ACT AEPB Inhale 1 puff into the lungs daily. 60 each 11   levothyroxine (SYNTHROID, LEVOTHROID) 50 MCG tablet Take 50 mcg by mouth daily before breakfast.     montelukast (SINGULAIR) 10 MG tablet TAKE 1 TABLET BY MOUTH EVERYDAY AT BEDTIME 90 tablet 3   multivitamin-lutein (OCUVITE-LUTEIN) CAPS capsule Take 1 capsule by mouth daily.     NONFORMULARY OR COMPOUNDED ITEM Biestrogen 60/40 0.375mg /ml cream Apply 6 clicks (0.39ml) to inner thigh or forearm daily Disp: 30 gram, Refills: 11 30 each 11   NONFORMULARY OR COMPOUNDED ITEM Progesterone 250mg  capsule Take one capsule daily at bedtime Disp: 90 capsules, Refills: 3 90 each 3   PRESCRIPTION MEDICATION 1 each daily.     traZODone (DESYREL) 150 MG tablet Take 150 mg by mouth at bedtime.     TRULICITY 1.5 MG/0.5ML SOPN As directed     venlafaxine XR (EFFEXOR-XR) 150 MG 24 hr capsule Take 150 mg by mouth 2 (two) times daily.     No current facility-administered medications for this visit.   Facility-Administered Medications Ordered in Other Visits  Medication Dose Route Frequency Provider Last Rate Last Admin   ketorolac (TORADOL) 15 MG/ML injection 15 mg  15 mg Intravenous Once [name], MD        REVIEW OF SYSTEMS:   Constitutional: Denies fevers, chills or abnormal night sweats  All other systems were reviewed with the patient and are negative.  PHYSICAL EXAMINATION: ECOG PERFORMANCE STATUS: 1 - Symptomatic but completely ambulatory  Vitals:   12/18/22 1307  BP: (!) 148/61  Pulse: 88   Resp: 18  Temp: 97.8 F (36.6 C)  SpO2: 93%   Filed Weights   12/18/22 1307  Weight: 239 lb 6.4 oz (108.6 kg)    GENERAL:alert, no distress and comfortable    LABORATORY DATA:  I have reviewed the data as listed Lab Results  Component Value Date   WBC 10.3 12/18/2022   HGB 11.6 (L) 12/18/2022   HCT 36.6 12/18/2022   MCV 86.9 12/18/2022   PLT 294 12/18/2022   Lab Results  Component Value Date   NA 136 12/18/2022   K 4.2 12/18/2022   CL 103 12/18/2022   CO2 28 12/18/2022    RADIOGRAPHIC STUDIES: I have personally reviewed the radiological reports and agreed with the findings in the report.  ASSESSMENT AND PLAN:  Malignant neoplasm of upper-outer quadrant of right breast in female, estrogen receptor positive (HCC) 12/10/2022:Screening mammogram detected  right breast mass UOQ 10 o'clock position: 0.7 cm, axilla negative, ultrasound biopsy: Grade 1 IDC with DCIS, ER 95%, PR 95%, Ki-67 5%, HER2 2+ by IHC, FISH negative  Pathology and radiology counseling: Discussed with the patient, the details of pathology including the type of breast cancer,the clinical staging, the significance of ER, PR and HER-2/neu receptors and the implications for treatment. After reviewing the pathology in detail, we proceeded to discuss the different treatment options between surgery, radiation, chemotherapy, antiestrogen therapies.  Treatment plan: Breast conserving surgery +/- Adjuvant radiation Antiestrogen therapy  Return to clinic after surgery to discuss final pathology report  All questions were answered. The patient knows to call the clinic with any problems, questions or concerns.    Tamsen Meek, MD 12/18/22

## 2022-12-18 NOTE — Assessment & Plan Note (Addendum)
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 After reviewing the pathology in detail, we proceeded to discuss the different treatment options between surgery, radiation, chemotherapy, antiestrogen therapies.  Treatment plan: Breast conserving surgery +/- Adjuvant radiation Antiestrogen therapy  Return to clinic after surgery to discuss final pathology report

## 2022-12-18 NOTE — Research (Signed)
Trial:  Exact Sciences 2021-05 - Specimen Collection Study to Evaluate Biomarkers in Subjects with Cancer  Patient [facility] was identified by Dr Pamelia Hoit as a potential candidate for the above listed study.  This Clinical Research Nurse met with [facility], [id number], on 12/18/22 in a manner and location that ensures patient privacy to discuss participation in the above listed research study.  Patient is Accompanied by her husband and daughter .  A copy of the informed consent document with embedded HIPAA language was provided to the patient.  Patient reads, speaks, and understands [place].   Patient was provided with the business card of this Nurse and encouraged to contact the research team with any questions.  Approximately 15 minutes were spent with the patient reviewing the informed consent documents.  Patient was provided the option of taking informed consent documents home to review and was encouraged to review at their convenience with their support network, including other care providers. Patient took the consent documents home to review.  Margret Chance Makaiyah Schweiger, RN, BSN, [facility]  Her  Hers Clinical Research Nurse [facility] Direct Dial [phone]  Pager [phone] 12/18/2022 4:25 PM

## 2022-12-19 ENCOUNTER — Encounter: Payer: Self-pay | Admitting: Genetic Counselor

## 2022-12-19 DIAGNOSIS — Z8481 Family history of carrier of genetic disease: Secondary | ICD-10-CM | POA: Insufficient documentation

## 2022-12-19 NOTE — Progress Notes (Signed)
REFERRING PROVIDER: Serena Croissant, MD [address] [place],  [place] [address]  PRIMARY PROVIDER:  Georgann Housekeeper, MD  PRIMARY REASON FOR VISIT:  1. Malignant neoplasm of upper-outer quadrant of right breast in female, estrogen receptor positive (HCC)   2. Family history of Lynch syndrome     HISTORY OF PRESENT ILLNESS:   Kayla Ray, a 72 y.o. female, was seen for a [place] cancer genetics consultation at the request of Dr. Pamelia Hoit due to a personal history of breast cancer and a reported family history of Lynch syndrome.  Kayla Ray presents to clinic today to discuss the possibility of a hereditary predisposition to cancer, to discuss genetic testing, and to further clarify her future cancer risks, as well as potential cancer risks for family members.   In June 2024, at the age of 48, Kayla Ray was diagnosed with invasive ductal carcinoma of the right breast (ER+/PR+/HER2-). The treatment plan is pending.    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 Hysterectomy: yes.  Ovaries intact: yes.    Past Medical History:  Diagnosis Date   Asthma    Breast cancer (HCC)    Diabetes mellitus without complication [facility])     Past Surgical History:  Procedure Laterality Date   BREAST BIOPSY Right 2019   BREAST BIOPSY Right 12/10/2022   [place] RT BREAST BX W LOC DEV 1ST LESION IMG  BX SPEC US GUIDE 12/10/2022 [facility] MAMMOGRAPHY   CATARACT EXTRACTION     CESAREAN SECTION     x2   GALLBLADDER SURGERY     VAGINAL HYSTERECTOMY  1984    FAMILY HISTORY:  We obtained a detailed, 4-generation family history.  Significant diagnoses are listed below: Family History  Problem Relation Age of Onset   Lung cancer Father        d. 34   Lymphoma Sister        dx 6s   Other Sister        Lynch syndrome   Stomach cancer Cousin        dx 51s; several other cancers; Lynch syndrome     Kayla Ray reported that her sister and paternal cousin both had genetic testing and was positive for Lynch syndrome.  Genetic testing reports were not available for review today.   There is no reported Ashkenazi Jewish ancestry. There is no known consanguinity.  GENETIC COUNSELING ASSESSMENT: Kayla Ray is a 72 y.o. female with a family history of Lynch syndrome. We, therefore, discussed and recommended the following at today's visit.   DISCUSSION: We discussed that 5 - 10% of cancer is hereditary.  Given her sister reportedly has a mutation in a Lynch syndrome gene, she has a 50% chance of having the same mutation related to Lynch syndrome.  We reviewed the colon, uterine, and colon cancer risks and management strategies (colonoscopies every 1-2 years, consideration  for oophorectomy and hysterectomy) associated with Lynch syndrome. We discussed that testing is beneficial for several reasons including knowing how to follow individuals for their cancer risks and understanding if other family members could be at risk for cancer and allowing them to undergo genetic testing.   We reviewed the characteristics, features and inheritance patterns of hereditary cancer syndromes. We discussed that her personal and family history is not highly suggestive of an additional hereditary cancer syndrome (such as a hereditary breast cancer syndrome) beyond Lynch syndrome. We also discussed genetic testing, including  the appropriate family members to test, the process of testing, insurance coverage and turn-around-time for results. We discussed the implications of a negative, positive, and/or variant of uncertain significant result. We recommended Kayla Ray pursue genetic testing for a panel that includes Lynch syndrome genes, genes associated with breast cancer, and genes associated with other cancers.    Based on Kayla Ray family history of Lynch syndrome, she meets medical criteria for genetic testing. Despite that she meets criteria, she may still have an out of pocket cost. We discussed that if her out of pocket cost for testing is over $100, the laboratory should contact her and discuss the self-pay prices and/or patient pay assistance programs.    PLAN: Kayla Ray did not wish to pursue genetic testing at today's visit.  She said that she may consider genetic testing after her breast cancer treatment.  Her daughter stated that she is not interested in understanding her own personal risk for Lynch-related cancers. We remain available to coordinate genetic testing at any time in the future, if desired. We, therefore, recommend Kayla Ray continue to follow the cancer screening guidelines given by her GI and primary healthcare provider.   Kayla Ray questions were answered to her satisfaction today. Our contact information was provided should additional questions or concerns arise. Thank you for the referral and allowing [place] to share in the care of your patient.   Kayla Ray M. Rennie Plowman, MS, [facility] Genetic Counselor [email or web address] (P) [phone]  The patient was seen for a total of 20 minutes in face-to-face genetic counseling.  The patient was accompanied by her daughter and husband.  Dr. Pamelia Hoit was available to discuss this case as needed.    _______________________________________________________________________ For Office Staff:  Number of people involved in session: 3 Was an Intern/  student involved with case: no

## 2022-12-20 ENCOUNTER — Other Ambulatory Visit: Payer: Self-pay | Admitting: General Surgery

## 2022-12-20 DIAGNOSIS — C50411 Malignant neoplasm of upper-outer quadrant of right female breast: Secondary | ICD-10-CM

## 2022-12-23 ENCOUNTER — Telehealth: Payer: Self-pay

## 2022-12-23 DIAGNOSIS — Z170 Estrogen receptor positive status [ER+]: Secondary | ICD-10-CM

## 2022-12-23 NOTE — Telephone Encounter (Signed)
Exact Sciences 2021-05 - Specimen Collection Study to Evaluate Biomarkers in Subjects with Cancer   Called the patient to confirm participation in the exact science study. The patient confirmed that they would be interested in doing he study. The patient will be coming on 10Jul2024 to complete study procedures.  Felecia Jan, [facility] 12/23/2022 10:21 AM

## 2022-12-27 ENCOUNTER — Encounter: Payer: Self-pay | Admitting: General Practice

## 2022-12-27 NOTE — Progress Notes (Signed)
[facility] Multidisciplinary Clinic Spiritual Care Note  Left voicemail for [facility] following Breast Multidisciplinary Clinic to introduce Support Center team/resources.  She completed SDOH screening; results follow below.  SDOH Interventions    Flowsheet Row Spiritual Care from 12/27/2022 in [facility]  SDOH Interventions   Food Insecurity Interventions Intervention Not Indicated       SDOH Screenings   Food Insecurity: No Food Insecurity (12/27/2022)  Housing: Low Risk  (12/27/2022)  Transportation Needs: No Transportation Needs (12/27/2022)  Utilities: Not At Risk (12/27/2022)  Depression (PHQ2-9): Low Risk  (12/27/2022)  Tobacco Use: Low Risk  (12/19/2022)    Follow up needed: Yes.  Out of office next week; plan to phone again in ca two weeks. LVM of introduction with direct number.   [address] [place], [place], [facility] Pager [phone] Voicemail [phone]

## 2022-12-31 ENCOUNTER — Encounter: Payer: Self-pay | Admitting: *Deleted

## 2023-01-06 ENCOUNTER — Encounter: Payer: Self-pay | Admitting: General Practice

## 2023-01-06 NOTE — Progress Notes (Signed)
[facility] Spiritual Care Note  Reached Ms Kayla Ray by phone for [facility] follow-up check-in. She reports that she has deep spiritual peace and strong social support from family, friends, and church. Gratitude is another key coping tool.  Introduced Actor. Added Ms Kayla Ray to regular programming info email list and registered her for Friday's [employer]lection Retreat, both per her request.  Provided pastoral presence, reflective listening, and affirmation of strengths. We plan to follow up in person at retreat, and Ms Kayla Ray has direct Spiritual Care number in case needs arise in the meantime.   [address] [place], [place], [facility] Pager [phone] Voicemail [phone]

## 2023-01-08 ENCOUNTER — Inpatient Hospital Stay: Payer: Medicare Other

## 2023-01-08 ENCOUNTER — Inpatient Hospital Stay: Payer: Medicare Other | Attending: Hematology and Oncology

## 2023-01-08 ENCOUNTER — Other Ambulatory Visit: Payer: Self-pay

## 2023-01-08 DIAGNOSIS — Z170 Estrogen receptor positive status [ER+]: Secondary | ICD-10-CM

## 2023-01-08 LAB — RESEARCH LABS

## 2023-01-08 NOTE — Research (Addendum)
Exact Sciences 2021-05 - Specimen Collection Study to Evaluate Biomarkers in Subjects with Cancer    Patient Kayla Ray [facility] was identified by Dr. Pamelia Hoit  as a potential candidate for the above listed study.  This Clinical Research Coordinator met with Kayla Ray, Kayla Ray on 01/08/23 in a manner and location that ensures patient privacy to discuss participation in the above listed research study.  Patient is Accompanied by husband .  Patient was previously provided with informed consent documents.  Patient confirmed they have read the informed consent documents.  As outlined in the informed consent form, this Coordinator and Kayla Ray discussed the purpose of the research study, the investigational nature of the study, study procedures and requirements for study participation, potential risks and benefits of study participation, as well as alternatives to participation.  This study is not blinded or double-blinded. The patient understands participation is voluntary and they may withdraw from study participation at any time.  This study does not involve randomization.  This study does not involve an investigational drug or device. This study does not involve a placebo. Patient understands enrollment is pending full eligibility review.   Confidentiality and how the patient's information will be used as part of study participation were discussed.  Patient was informed there is not reimbursement provided for their time and effort spent on trial participation.  The patient is encouraged to discuss research study participation with their insurance provider to determine what costs they may incur as part of study participation, including research related injury.    All questions were answered to patient's satisfaction.  The informed consent with embedded HIPAA language was reviewed page by page.  The patient's mental and emotional status is appropriate to provide informed consent, and the patient  verbalizes an understanding of study participation.  Patient has agreed to participate in the above listed research study and has voluntarily signed the informed consent version Kayla Ray with embedded HIPAA language, version 3  on 01/08/23 at 10:30AM.  The patient was provided with a copy of the signed informed consent form with embedded HIPAA language for their reference.  No study specific procedures were obtained prior to the signing of the informed consent document.  Approximately 30 minutes were spent with the patient reviewing the informed consent documents.  Patient was not requested to complete a Release of Information form.   This Coordinator has reviewed this patient's inclusion and exclusion criteria and confirmed Kayla Ray is eligible for study participation.  Patient will continue with enrollment.  Menopausal status (women only): Kayla Ray is postmenopausal   Eligibility confirmed by treating investigator, who also agrees that patient should proceed with enrollment.   Medical History:  High Blood Pressure  No Coronary Artery Disease No Lupus    No Rheumatoid Arthritis  No Diabetes   Yes      If yes, which type?      Type 2 Lynch Syndrome  No  Is the patient currently taking a magnesium supplement?   No If yes, dose and frequency? N/a Indication? N/a Start date? N/a  Does the patient have a personal history of cancer (greater than 5 years ago)?  No If yes, Cancer type and date of diagnosis?   N/a  Has this previous diagnosis been treated? N/a      If so, treatment type? N/a   Start and end dates of last treatment cycle? N/a  Does the patient have a family history of cancer in 1st or 2nd  degree relatives? Yes If yes, Relationship(s) and Cancer type(s)? Sister- lynch, Father- lung, sister- lymphoma   Does the patient have history of alcohol consumption? Yes   If yes, current or former? Current  If former, year stopped? N/a Number  of years? 56 Drinks per week? 1  Does the patient have history of cigarette, cigar, pipe, or chewing tobacco use?  No  If yes, current for former? N/a If yes, type (Cigarette, cigar, pipe, and/or chewing tobacco)? N/a   If former, year stopped? N/a Number of years? N/a Packs/number/containers per day? N/a   After the consenting process and once all study question were answered, the patient was escorted to the Lab for blood collection by Kayla Ray. Once the blood was collected, the patient was given their visa gift card and thanked for their participation.   Kayla Ray, [facility] 01/08/2023 10:32 AM

## 2023-01-09 NOTE — Pre-Procedure Instructions (Signed)
Surgical Instructions   Your procedure is scheduled on Monday, July 22nd. Report to [facility] Main Entrance "A" at 09:30 A.M., then check in with the Admitting office. Any questions or running late day of surgery: call [phone]  Questions prior to your surgery date: call [phone], Monday-Friday, 8am-4pm. If you experience any cold or flu symptoms such as cough, fever, chills, shortness of breath, etc. between now and your scheduled surgery, please notify us at the above number.     Remember:  Do not eat after midnight the night before your surgery  You may drink clear liquids until 08:30 AM the morning of your surgery.   Clear liquids allowed are: Water, Non-Citrus Juices (without pulp), Carbonated Beverages, Clear Tea, Black Coffee Only (NO MILK, CREAM OR POWDERED CREAMER of any kind), and Gatorade.    Take these medicines the morning of surgery with A SIP OF WATER  atorvastatin (LIPITOR)  Dorzolamide HCl-Timolol Mal PF  levothyroxine (SYNTHROID, LEVOTHROID)  venlafaxine XR [facility])     May take these medicines IF NEEDED: acetaminophen (TYLENOL)  albuterol (PROVENTIL HFA;VENTOLIN HFA)- bring inhaler with you on day of surgery  albuterol (PROVENTIL)  fluticasone (FLONASE)  fluticasone furoate-vilanterol (BREO ELLIPTA)     One week prior to surgery, STOP taking any Aspirin (unless otherwise instructed by your surgeon) Aleve, Naproxen, Ibuprofen, Motrin, Advil, Goody's, BC's, all herbal medications, fish oil, and non-prescription vitamins.                     Do NOT Smoke (Tobacco/Vaping) for 24 hours prior to your procedure.  If you use a CPAP at night, you may bring your mask/headgear for your overnight stay.   You will be asked to remove any contacts, glasses, piercing's, hearing aid's, dentures/partials prior to surgery. Please bring cases for these items if needed.    Patients discharged the day of surgery will not be allowed to drive home, and someone needs  to stay with them for 24 hours.  SURGICAL WAITING ROOM VISITATION Patients may have no more than 2 support people in the waiting area - these visitors may rotate.   Pre-op nurse will coordinate an appropriate time for 1 ADULT support person, who may not rotate, to accompany patient in pre-op.  Children under the age of 40 must have an adult with them who is not the patient and must remain in the main waiting area with an adult.  If the patient needs to stay at the hospital during part of their recovery, the visitor guidelines for inpatient rooms apply.  Please refer to the [facility] website for the visitor guidelines for any additional information.   If you received a COVID test during your pre-op visit  it is requested that you wear a mask when out in public, stay away from anyone that may not be feeling well and notify your surgeon if you develop symptoms. If you have been in contact with anyone that has tested positive in the last 10 days please notify you surgeon.      Pre-operative CHG Bath Instructions   You can play a key role in reducing the risk of infection after surgery. Your skin needs to be as free of germs as possible. You can reduce the number of germs on your skin by washing with CHG (chlorhexidine gluconate) soap before surgery. CHG is an antiseptic soap that kills germs and continues to kill germs even after washing.   DO NOT use if you have an allergy to  chlorhexidine/CHG or antibacterial soaps. If your skin becomes reddened or irritated, stop using the CHG and notify one of our RNs at [phone].   TAKE A SHOWER THE NIGHT BEFORE SURGERY AND THE DAY OF SURGERY    Please keep in mind the following:  DO NOT shave, including legs and underarms, 48 hours prior to surgery.   You may shave your face before/day of surgery.  Place clean sheets on your bed the night before surgery Use a clean washcloth (not used since being washed) for each shower. DO NOT sleep with pet's  night before surgery.  CHG Shower Instructions:  If you choose to wash your hair and private area, wash first with your normal shampoo/soap.  After you use shampoo/soap, rinse your hair and body thoroughly to remove shampoo/soap residue.  Turn the water OFF and apply half the bottle of CHG soap to a CLEAN washcloth.  Apply CHG soap ONLY FROM YOUR NECK DOWN TO YOUR TOES (washing for 3-5 minutes)  DO NOT use CHG soap on face, private areas, open wounds, or sores.  Pay special attention to the area where your surgery is being performed.  If you are having back surgery, having someone wash your back for you may be helpful. Wait 2 minutes after CHG soap is applied, then you may rinse off the CHG soap.  Pat dry with a clean towel  Put on clean clothes/pajamas    Additional instructions for the day of surgery: DO NOT APPLY any lotions, deodorants, cologne, or perfumes.   Do not wear jewelry or makeup Do not wear nail polish, gel polish, artificial nails, or any other type of covering on natural nails (fingers and toes) Do not bring valuables to the hospital. [facility] is not responsible for valuables/personal belongings. Put on clean/comfortable clothes.  Please brush your teeth.  Ask your nurse before applying any prescription medications to the skin.

## 2023-01-10 ENCOUNTER — Encounter (HOSPITAL_COMMUNITY)
Admission: RE | Admit: 2023-01-10 | Discharge: 2023-01-10 | Disposition: A | Discharge status: Home / Self Care | Payer: Medicare Other | Source: Ambulatory Visit | Attending: General Surgery | Admitting: General Surgery

## 2023-01-10 ENCOUNTER — Other Ambulatory Visit: Payer: Self-pay

## 2023-01-10 ENCOUNTER — Encounter (HOSPITAL_COMMUNITY): Payer: Self-pay

## 2023-01-10 VITALS — BP 131/69 | HR 95 | Temp 98.2°F | Resp 18 | Ht 59.0 in | Wt 242.3 lb

## 2023-01-10 DIAGNOSIS — E039 Hypothyroidism, unspecified: Secondary | ICD-10-CM | POA: Diagnosis not present

## 2023-01-10 DIAGNOSIS — E119 Type 2 diabetes mellitus without complications: Secondary | ICD-10-CM | POA: Diagnosis not present

## 2023-01-10 DIAGNOSIS — Z01818 Encounter for other preprocedural examination: Secondary | ICD-10-CM | POA: Diagnosis not present

## 2023-01-10 DIAGNOSIS — I4510 Unspecified right bundle-branch block: Secondary | ICD-10-CM | POA: Insufficient documentation

## 2023-01-10 DIAGNOSIS — J45909 Unspecified asthma, uncomplicated: Secondary | ICD-10-CM | POA: Insufficient documentation

## 2023-01-10 DIAGNOSIS — G4733 Obstructive sleep apnea (adult) (pediatric): Secondary | ICD-10-CM | POA: Insufficient documentation

## 2023-01-10 DIAGNOSIS — C50911 Malignant neoplasm of unspecified site of right female breast: Secondary | ICD-10-CM | POA: Insufficient documentation

## 2023-01-10 HISTORY — DX: Depression, unspecified: F32.A

## 2023-01-10 HISTORY — DX: Sleep apnea, unspecified: G47.30

## 2023-01-10 HISTORY — DX: Unspecified osteoarthritis, unspecified site: M19.90

## 2023-01-10 HISTORY — DX: Hypothyroidism, unspecified: E03.9

## 2023-01-10 LAB — CBC
HCT: 37.5 % (ref 36.0–46.0)
Hemoglobin: 11.8 g/dL — ABNORMAL LOW (ref 12.0–15.0)
MCH: 27.9 pg (ref 26.0–34.0)
MCHC: 31.5 g/dL (ref 30.0–36.0)
MCV: 88.7 fL (ref 80.0–100.0)
Platelets: 260 10*3/uL (ref 150–400)
RBC: 4.23 MIL/uL (ref 3.87–5.11)
RDW: 14.9 % (ref 11.5–15.5)
WBC: 9.7 10*3/uL (ref 4.0–10.5)
nRBC: 0 % (ref 0.0–0.2)

## 2023-01-10 LAB — GLUCOSE, CAPILLARY: Glucose-Capillary: 225 mg/dL — ABNORMAL HIGH (ref 70–99)

## 2023-01-10 NOTE — Progress Notes (Addendum)
PCP - Dagoberto Reef Cardiologist - Denies  PPM/ICD - Denies Device Orders - N/A Rep Notified - N/A  Chest x-ray - Denies EKG - Pending 01-10-2023 Stress Test - Patient says 15+ years ago.Patient says everything was fine does not remember why it was done. ECHO - Patient says 15+ years ago. Patient says everything was fine does not remember why it was done Cardiac Cath - Denies  Sleep Study - Yes CPAP - Yes. Patient does not remember settings  Fasting Blood Sugar - Blood sugar at PAT this am was 225. Patient says she ate ice cream late evening and had cereal for breakfast this am. Checks Blood Sugar 3-4  times a week. Patients says fasting blood sugar between 140-145.  Last dose of GLP1 agonist-  Deneis  Blood Thinner Instructions:Denies  ERAS Protcol Yes No drink  COVID TEST- N/A  Anesthesia review: yes abnormal EKG  Patient denies shortness of breath, fever, cough and chest pain at PAT appointment   All instructions explained to the patient, with a verbal understanding of the material. Patient agrees to go over the instructions while at home for a better understanding. Patient also instructed to self quarantine after being tested for COVID-19. The opportunity to ask questions was provided.

## 2023-01-10 NOTE — Progress Notes (Signed)
PCP - Dagoberto Reef Cardiologist - Denies  PPM/ICD - Denies Device Orders - N/A Rep Notified - N/A  Chest x-ray - Denies EKG - Pending 01-10-2023 Stress Test - Patient says 15+ years ago.Patient says everything was fine does not remember why it was done. ECHO - Patient says 15+ years ago. Patient says everything was fine does not remember why it was done Cardiac Cath - Denies  Sleep Study - Yes CPAP - Yes. Patient does not remember settings  Fasting Blood Sugar - Blood sugar at PAT this am was 225. Patient says she ate ice cream late evening and had cereal for breakfast this am. Checks Blood Sugar 3-4  times a week. Patients says fasting blood sugar between 140-145.  Last dose of GLP1 agonist-  Deneis  Blood Thinner Instructions:Denies  ERAS Protcol Yes No drink  COVID TEST- N/A  Anesthesia review: yes abnormal EKG and breast seed placement  Patient denies shortness of breath, fever, cough and chest pain at PAT appointment   All instructions explained to the patient, with a verbal understanding of the material. Patient agrees to go over the instructions while at home for a better understanding. Patient also instructed to self quarantine after being tested for COVID-19. The opportunity to ask questions was provided.

## 2023-01-11 LAB — HEMOGLOBIN A1C
Hgb A1c MFr Bld: 6.9 % — ABNORMAL HIGH (ref 4.8–5.6)
Mean Plasma Glucose: 151 mg/dL

## 2023-01-13 ENCOUNTER — Encounter (HOSPITAL_COMMUNITY): Payer: Self-pay

## 2023-01-13 NOTE — Anesthesia Preprocedure Evaluation (Addendum)
Anesthesia Evaluation  Patient identified by MRN, date of birth, ID band Patient awake    Reviewed: Allergy & Precautions, NPO status , Patient's Chart, lab work & pertinent test results  Airway Mallampati: III       Dental no notable dental hx.    Pulmonary asthma , sleep apnea and Continuous Positive Airway Pressure Ventilation    Pulmonary exam normal breath sounds clear to auscultation       Cardiovascular negative cardio ROS Normal cardiovascular exam Rhythm:Regular Rate:Normal     Neuro/Psych  PSYCHIATRIC DISORDERS  Depression    negative neurological ROS     GI/Hepatic negative GI ROS, Neg liver ROS,,,  Endo/Other  diabetesHypothyroidism  Morbid obesity  Renal/GU negative Renal ROS     Musculoskeletal  (+) Arthritis ,    Abdominal  (+) + obese  Peds  Hematology negative hematology ROS (+)   Anesthesia Other Findings RIGHT BREAST CANCER  Reproductive/Obstetrics                             Anesthesia Physical Anesthesia Plan  ASA: 3  Anesthesia Plan:    Post-op Pain Management: Minimal or no pain anticipated   Induction: Intravenous  PONV Risk Score and Plan: 3 and Treatment may vary due to age or medical condition and Ondansetron  Airway Management Planned: LMA  Additional Equipment: None  Intra-op Plan:   Post-operative Plan: Extubation in OR  Informed Consent: I have reviewed the patients History and Physical, chart, labs and discussed the procedure including the risks, benefits and alternatives for the proposed anesthesia with the patient or authorized representative who has indicated his/her understanding and acceptance.     Dental advisory given  Plan Discussed with: CRNA and Anesthesiologist  Anesthesia Plan Comments: (PAT note written 01/13/2023 by Shonna Chock, PA-C. CBC 01/10/23, CMET 12/18/22.  )       Anesthesia Quick Evaluation

## 2023-01-13 NOTE — Progress Notes (Signed)
Anesthesia Chart Review:  Case: [id number] Date/Time: 01/20/23 1115   Procedure: RIGHT BREAST LUMPECTOMY WITH RADIOACTIVE SEED LOCALIZATION (Right)   Anesthesia type: General   Pre-op diagnosis: RIGHT BREAST CANCER   [facility]: [facility] ROOM [facility] / [facility]   Surgeons: Griselda Miner, MD       DISCUSSION: Patient is a 72 year old female scheduled for the above procedure.  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  A1c 6.9%. She reported typical fasting CBGs ~ 140-145.   01/10/23 EKG showed NSR, RBBB, inferior infarct (age undetermined). RBBB new since 2019. I called and spoke with her on 01/13/23. 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 She denied chest pain, SOB, edema, palpitations, syncope, presyncope. No known orthopnea, but she tends to sleep on her side. She is compliant with CPAP. She has someone who helps clean her house and does not have stairs to climb, but she is able to do all her ADLs, does her own grocery shopping, and is able to walk up and down the hospital hallways without stopping when she volunteers. She says asthma has been well-controlled.  RSL at [facility] 01/17/23 at 2:30 PM. Her last metabolic panel was on 12/18/22 and was normal other than glucose of 173 and mildly decreased total protein at 6.3. Her renal function has been normal since at least June 2019, and she is not on a diuretic/ACEi/or ARB, so I do not anticipate that she will need to get metabolic panel prior to surgery but will defer to her assigned anesthesiologist. Her CBC on 01/10/23 was stable with H/H 11.8/37.5.  She will need a glucose level given DM. EKG reviewed with anesthesiologist Lewie Loron, MD with no additional preoperative recommendation if she remains asymptomatic from a CV standpoint.    VS: BP 131/69   Pulse 95   Temp  36.8 C (Oral)   Resp 18   Ht 4\' 11"  (1.499 m)   Wt 109.9 kg   SpO2 98%   BMI 48.94 kg/m    PROVIDERS: Georgann Housekeeper, MD is PCP  Serena Croissant, MD is HEM-ONC Lonie Peak, MD is RAD-ONC Felisa Bonier, MD is pulmonologist. Six month follow-up at 09/17/22 visit for OSA and likely mild intermittent asthma.  Medeiros, Felipe Lolita Cram, MD is ophthalmologist ([facility])   LABS: Preoperative labs noted. H/H 11.8/37.5. A1c 6.9%. It appears BMET was not done at PAT visit. She did have a CMET on 12/18/22 that was normal other than glucose 173 and total protein of 6.3. Her Creatinine was 0.72.  (all labs ordered are listed, but only abnormal results are displayed)  Labs Reviewed  GLUCOSE, CAPILLARY - Abnormal; Notable for the following components:      Result Value   Glucose-Capillary 225 (*)    All other components within normal limits  HEMOGLOBIN A1C - Abnormal; Notable for the following components:   Hgb A1c MFr Bld 6.9 (*)    All other components within normal limits  CBC - Abnormal; Notable for the following components:   Hemoglobin 11.8 (*)    All other components within normal limits    PFTs 04/10/21: FVC 1.94 (79%, post 1.89 (77%). FEV1 1.58 (85%), post 1.55 (92%). DLCO unc 15.11 (92%), cor 15.56 (95%).     IMAGES: CXR 02/13/21: FINDINGS: The heart size and mediastinal contours are within normal limits. Both lungs  are clear. The visualized skeletal structures are unremarkable. IMPRESSION: No active cardiopulmonary disease.   EKG: 01/10/23: Normal sinus rhythm Right bundle branch block Inferior infarct , age undetermined Abnormal ECG When compared with ECG of 03-Dec-2017 22:35, new RBBB Confirmed by Julien Nordmann [phone]) on 01/10/2023 3:40:27 PM   CV: She reported a remote history of prior normal stress test and echo > 15 years ago.   Past Medical History:  Diagnosis Date   Arthritis    Asthma    Breast cancer (HCC)    Depression    patient says it is  for depression   Diabetes mellitus without complication (HCC)    Hypothyroidism    Sleep apnea     Past Surgical History:  Procedure Laterality Date   BREAST BIOPSY Right 2019   BREAST BIOPSY Right 12/10/2022   [place] RT BREAST BX W LOC DEV 1ST LESION IMG BX SPEC US GUIDE 12/10/2022 [facility]   CATARACT EXTRACTION     CESAREAN SECTION     x2   GALLBLADDER SURGERY     VAGINAL HYSTERECTOMY  1984    MEDICATIONS:  acetaminophen (TYLENOL) 325 MG tablet   albuterol (PROVENTIL HFA;VENTOLIN HFA) 108 (90 Base) MCG/ACT inhaler   albuterol (PROVENTIL) (2.5 MG/3ML) 0.083% nebulizer solution   atorvastatin (LIPITOR) 10 MG tablet   bimatoprost (LUMIGAN) 0.01 % SOLN   Dorzolamide HCl-Timolol Mal PF 2-0.5 % SOLN   estazolam (PROSOM) 2 MG tablet   fluticasone (FLONASE) 50 MCG/ACT nasal spray   fluticasone furoate-vilanterol (BREO ELLIPTA) 100-25 MCG/ACT AEPB   lamoTRIgine (LAMICTAL) 25 MG tablet   levothyroxine (SYNTHROID, LEVOTHROID) 50 MCG tablet   montelukast (SINGULAIR) 10 MG tablet   multivitamin-lutein (OCUVITE-LUTEIN) CAPS capsule   NON FORMULARY   NONFORMULARY OR COMPOUNDED ITEM   NONFORMULARY OR COMPOUNDED ITEM   Travoprost, BAK Free, (TRAVATAN) 0.004 % SOLN ophthalmic solution   traZODone (DESYREL) 100 MG tablet   venlafaxine XR (EFFEXOR-XR) 150 MG 24 hr capsule   No current facility-administered medications for this encounter.    Shonna Chock, PA-C Surgical Short Stay/Anesthesiology [facility] Phone [phone] [facility] Phone [phone] 01/13/2023 4:38 PM

## 2023-01-14 DIAGNOSIS — C50911 Malignant neoplasm of unspecified site of right female breast: Secondary | ICD-10-CM | POA: Diagnosis not present

## 2023-01-16 ENCOUNTER — Encounter (HOSPITAL_BASED_OUTPATIENT_CLINIC_OR_DEPARTMENT_OTHER): Payer: Self-pay | Admitting: General Surgery

## 2023-01-17 ENCOUNTER — Ambulatory Visit
Admission: RE | Admit: 2023-01-17 | Discharge: 2023-01-17 | Disposition: A | Discharge status: Home / Self Care | Payer: Medicare Other | Source: Ambulatory Visit | Attending: General Surgery | Admitting: General Surgery

## 2023-01-17 DIAGNOSIS — C50411 Malignant neoplasm of upper-outer quadrant of right female breast: Secondary | ICD-10-CM

## 2023-01-17 DIAGNOSIS — C50911 Malignant neoplasm of unspecified site of right female breast: Secondary | ICD-10-CM | POA: Diagnosis not present

## 2023-01-17 HISTORY — PX: BREAST BIOPSY: SHX20

## 2023-01-17 NOTE — Progress Notes (Signed)
Patient texted to arrive at 0815 on 7/22 for same day labs.

## 2023-01-20 ENCOUNTER — Encounter (HOSPITAL_BASED_OUTPATIENT_CLINIC_OR_DEPARTMENT_OTHER)
Admission: RE | Disposition: A | Discharge status: Home / Self Care | Payer: Self-pay | Source: Home / Self Care | Attending: General Surgery

## 2023-01-20 ENCOUNTER — Ambulatory Visit
Admission: RE | Admit: 2023-01-20 | Discharge: 2023-01-20 | Disposition: A | Discharge status: Home / Self Care | Payer: Medicare Other | Source: Ambulatory Visit | Attending: General Surgery | Admitting: General Surgery

## 2023-01-20 ENCOUNTER — Other Ambulatory Visit: Payer: Self-pay

## 2023-01-20 ENCOUNTER — Encounter (HOSPITAL_BASED_OUTPATIENT_CLINIC_OR_DEPARTMENT_OTHER): Payer: Self-pay | Admitting: General Surgery

## 2023-01-20 ENCOUNTER — Ambulatory Visit (HOSPITAL_BASED_OUTPATIENT_CLINIC_OR_DEPARTMENT_OTHER): Payer: Medicare Other | Admitting: Anesthesiology

## 2023-01-20 ENCOUNTER — Ambulatory Visit (HOSPITAL_BASED_OUTPATIENT_CLINIC_OR_DEPARTMENT_OTHER): Payer: Medicare Other | Admitting: Physician Assistant

## 2023-01-20 ENCOUNTER — Ambulatory Visit (HOSPITAL_BASED_OUTPATIENT_CLINIC_OR_DEPARTMENT_OTHER)
Admission: RE | Admit: 2023-01-20 | Discharge: 2023-01-20 | Disposition: A | Discharge status: Home / Self Care | Payer: Medicare Other | Source: Home / Self Care | Attending: General Surgery | Admitting: General Surgery

## 2023-01-20 DIAGNOSIS — C50911 Malignant neoplasm of unspecified site of right female breast: Secondary | ICD-10-CM

## 2023-01-20 DIAGNOSIS — Z7985 Long-term (current) use of injectable non-insulin antidiabetic drugs: Secondary | ICD-10-CM | POA: Insufficient documentation

## 2023-01-20 DIAGNOSIS — F32A Depression, unspecified: Secondary | ICD-10-CM | POA: Insufficient documentation

## 2023-01-20 DIAGNOSIS — Z6841 Body Mass Index (BMI) 40.0 and over, adult: Secondary | ICD-10-CM | POA: Insufficient documentation

## 2023-01-20 DIAGNOSIS — G4733 Obstructive sleep apnea (adult) (pediatric): Secondary | ICD-10-CM | POA: Diagnosis not present

## 2023-01-20 DIAGNOSIS — J45909 Unspecified asthma, uncomplicated: Secondary | ICD-10-CM | POA: Insufficient documentation

## 2023-01-20 DIAGNOSIS — M1990 Unspecified osteoarthritis, unspecified site: Secondary | ICD-10-CM | POA: Insufficient documentation

## 2023-01-20 DIAGNOSIS — E119 Type 2 diabetes mellitus without complications: Secondary | ICD-10-CM | POA: Diagnosis not present

## 2023-01-20 DIAGNOSIS — Z170 Estrogen receptor positive status [ER+]: Secondary | ICD-10-CM | POA: Diagnosis not present

## 2023-01-20 DIAGNOSIS — E039 Hypothyroidism, unspecified: Secondary | ICD-10-CM | POA: Insufficient documentation

## 2023-01-20 DIAGNOSIS — Z1231 Encounter for screening mammogram for malignant neoplasm of breast: Secondary | ICD-10-CM | POA: Insufficient documentation

## 2023-01-20 DIAGNOSIS — C50411 Malignant neoplasm of upper-outer quadrant of right female breast: Secondary | ICD-10-CM | POA: Insufficient documentation

## 2023-01-20 DIAGNOSIS — G4730 Sleep apnea, unspecified: Secondary | ICD-10-CM | POA: Insufficient documentation

## 2023-01-20 HISTORY — PX: BREAST LUMPECTOMY WITH RADIOACTIVE SEED LOCALIZATION: SHX6424

## 2023-01-20 LAB — BASIC METABOLIC PANEL
Anion gap: 8 (ref 5–15)
BUN: 10 mg/dL (ref 8–23)
CO2: 24 mmol/L (ref 22–32)
Calcium: 8.5 mg/dL — ABNORMAL LOW (ref 8.9–10.3)
Chloride: 102 mmol/L (ref 98–111)
Creatinine, Ser: 0.76 mg/dL (ref 0.44–1.00)
GFR, Estimated: >60 mL/min (ref >60)
Glucose, Bld: 147 mg/dL — ABNORMAL HIGH (ref 70–99)
Potassium: 3.8 mmol/L (ref 3.5–5.1)
Sodium: 134 mmol/L — ABNORMAL LOW (ref 135–145)

## 2023-01-20 LAB — GLUCOSE, CAPILLARY
Glucose-Capillary: 139 mg/dL — ABNORMAL HIGH (ref 70–99)
Glucose-Capillary: 165 mg/dL — ABNORMAL HIGH (ref 70–99)

## 2023-01-20 SURGERY — BREAST LUMPECTOMY WITH RADIOACTIVE SEED LOCALIZATION
Anesthesia: General | Site: Breast | Laterality: Right

## 2023-01-20 MED ORDER — GABAPENTIN 100 MG PO CAPS
100.0000 mg | ORAL_CAPSULE | ORAL | Status: AC
  Administered 2023-01-20: 100 mg via ORAL

## 2023-01-20 MED ORDER — FENTANYL CITRATE (PF) 100 MCG/2ML IJ SOLN
INTRAMUSCULAR | Status: DC | PRN
  Administered 2023-01-20: 100 ug via INTRAVENOUS

## 2023-01-20 MED ORDER — PROPOFOL 10 MG/ML IV BOLUS
INTRAVENOUS | Status: DC | PRN
Start: 2023-01-20 — End: 2023-01-20
  Administered 2023-01-20: 180 mg via INTRAVENOUS

## 2023-01-20 MED ORDER — CEFAZOLIN SODIUM-DEXTROSE 2-4 GM/100ML-% IV SOLN
2.0000 g | INTRAVENOUS | Status: AC
  Administered 2023-01-20: 2 g via INTRAVENOUS

## 2023-01-20 MED ORDER — CHLORHEXIDINE GLUCONATE CLOTH 2 % EX PADS: 6.0000 | MEDICATED_PAD | Freq: Once | CUTANEOUS | Status: DC

## 2023-01-20 MED ORDER — ONDANSETRON HCL 4 MG/2ML IJ SOLN
INTRAMUSCULAR | Status: DC | PRN
  Administered 2023-01-20: 4 mg via INTRAVENOUS

## 2023-01-20 MED ORDER — ACETAMINOPHEN 500 MG PO TABS
ORAL_TABLET | ORAL | Status: AC
  Filled 2023-01-20: qty 2

## 2023-01-20 MED ORDER — ORAL CARE MOUTH RINSE: 15.0000 mL | Freq: Once | OROMUCOSAL | Status: DC

## 2023-01-20 MED ORDER — SUCCINYLCHOLINE CHLORIDE 200 MG/10ML IV SOSY
PREFILLED_SYRINGE | INTRAVENOUS | Status: DC | PRN
  Administered 2023-01-20: 140 mg via INTRAVENOUS

## 2023-01-20 MED ORDER — FENTANYL CITRATE (PF) 100 MCG/2ML IJ SOLN
INTRAMUSCULAR | Status: AC
  Filled 2023-01-20: qty 2

## 2023-01-20 MED ORDER — ONDANSETRON HCL 4 MG/2ML IJ SOLN
INTRAMUSCULAR | Status: AC
  Filled 2023-01-20: qty 2

## 2023-01-20 MED ORDER — CEFAZOLIN SODIUM-DEXTROSE 2-4 GM/100ML-% IV SOLN
INTRAVENOUS | Status: AC
  Filled 2023-01-20: qty 100

## 2023-01-20 MED ORDER — DEXAMETHASONE SODIUM PHOSPHATE 4 MG/ML IJ SOLN
INTRAMUSCULAR | Status: DC | PRN
  Administered 2023-01-20: 5 mg via INTRAVENOUS

## 2023-01-20 MED ORDER — GABAPENTIN 100 MG PO CAPS
ORAL_CAPSULE | ORAL | Status: AC
  Filled 2023-01-20: qty 1

## 2023-01-20 MED ORDER — LACTATED RINGERS IV SOLN: INTRAVENOUS | Status: DC

## 2023-01-20 MED ORDER — DEXAMETHASONE SODIUM PHOSPHATE 10 MG/ML IJ SOLN
INTRAMUSCULAR | Status: AC
  Filled 2023-01-20: qty 1

## 2023-01-20 MED ORDER — LIDOCAINE HCL (CARDIAC) PF 100 MG/5ML IV SOSY
PREFILLED_SYRINGE | INTRAVENOUS | Status: DC | PRN
  Administered 2023-01-20: 80 mg via INTRAVENOUS

## 2023-01-20 MED ORDER — LIDOCAINE 2% (20 MG/ML) 5 ML SYRINGE
INTRAMUSCULAR | Status: AC
  Filled 2023-01-20: qty 5

## 2023-01-20 MED ORDER — BUPIVACAINE-EPINEPHRINE (PF) 0.25% -1:200000 IJ SOLN
INTRAMUSCULAR | Status: DC | PRN
  Administered 2023-01-20: 20 mL via PERINEURAL

## 2023-01-20 MED ORDER — OXYCODONE HCL 5 MG PO TABS: 5.0000 mg | ORAL_TABLET | Freq: Four times a day (QID) | ORAL | 0 refills | Status: DC | PRN

## 2023-01-20 MED ORDER — PROPOFOL 10 MG/ML IV BOLUS
INTRAVENOUS | Status: AC
  Filled 2023-01-20: qty 20

## 2023-01-20 MED ORDER — ACETAMINOPHEN 500 MG PO TABS
1000.0000 mg | ORAL_TABLET | ORAL | Status: AC
  Administered 2023-01-20: 1000 mg via ORAL

## 2023-01-20 MED ORDER — CHLORHEXIDINE GLUCONATE 0.12 % MT SOLN: 15.0000 mL | Freq: Once | OROMUCOSAL | Status: DC

## 2023-01-20 SURGICAL SUPPLY — 42 items
ADH SKN CLS APL DERMABOND .7 (GAUZE/BANDAGES/DRESSINGS) ×1
APL PRP STRL LF DISP 70% ISPRP (MISCELLANEOUS) ×1
APPLIER CLIP 9.375 MED OPEN (MISCELLANEOUS)
APR CLP MED 9.3 20 MLT OPN (MISCELLANEOUS)
BLADE SURG 15 STRL LF DISP TIS (BLADE) ×1 IMPLANT
BLADE SURG 15 STRL SS (BLADE) ×1
CANISTER SUC SOCK COL 7IN (MISCELLANEOUS) ×1 IMPLANT
CANISTER SUCT 1200ML W/VALVE (MISCELLANEOUS) ×1 IMPLANT
CHLORAPREP W/TINT 26 (MISCELLANEOUS) ×1 IMPLANT
CLIP APPLIE 9.375 MED OPEN (MISCELLANEOUS) IMPLANT
COVER BACK TABLE 60X90IN (DRAPES) ×1 IMPLANT
COVER MAYO STAND STRL (DRAPES) ×1 IMPLANT
COVER PROBE CYLINDRICAL 5X96 (MISCELLANEOUS) ×1 IMPLANT
DERMABOND ADVANCED .7 DNX12 (GAUZE/BANDAGES/DRESSINGS) ×1 IMPLANT
DRAPE LAPAROSCOPIC ABDOMINAL (DRAPES) ×1 IMPLANT
DRAPE UTILITY XL STRL (DRAPES) ×1 IMPLANT
ELECT COATED BLADE 2.86 ST (ELECTRODE) ×1 IMPLANT
ELECT REM PT RETURN 9FT ADLT (ELECTROSURGICAL) ×1
ELECTRODE REM PT RTRN 9FT ADLT (ELECTROSURGICAL) ×1 IMPLANT
GLOVE BIO SURGEON STRL SZ 6.5 (GLOVE) IMPLANT
GLOVE BIO SURGEON STRL SZ7 (GLOVE) IMPLANT
GLOVE BIO SURGEON STRL SZ7.5 (GLOVE) ×2 IMPLANT
GLOVE BIOGEL PI IND STRL 7.0 (GLOVE) IMPLANT
GOWN STRL REUS W/ TWL LRG LVL3 (GOWN DISPOSABLE) ×2 IMPLANT
GOWN STRL REUS W/TWL LRG LVL3 (GOWN DISPOSABLE) ×3
KIT MARKER MARGIN INK (KITS) ×1 IMPLANT
NDL HYPO 25X1 1.5 SAFETY (NEEDLE) IMPLANT
NEEDLE HYPO 25X1 1.5 SAFETY (NEEDLE)
NS IRRIG 1000ML POUR BTL (IV SOLUTION) IMPLANT
PACK BASIN DAY SURGERY FS (CUSTOM PROCEDURE TRAY) ×1 IMPLANT
PENCIL SMOKE EVACUATOR (MISCELLANEOUS) ×1 IMPLANT
SLEEVE SCD COMPRESS KNEE MED (STOCKING) ×1 IMPLANT
SPIKE FLUID TRANSFER (MISCELLANEOUS) IMPLANT
SPONGE T-LAP 18X18 ~~LOC~~+RFID (SPONGE) ×1 IMPLANT
SUT MON AB 4-0 PC3 18 (SUTURE) ×1 IMPLANT
SUT SILK 2 0 SH (SUTURE) IMPLANT
SUT VICRYL 3-0 CR8 SH (SUTURE) ×1 IMPLANT
SYR CONTROL 10ML LL (SYRINGE) IMPLANT
TOWEL GREEN STERILE FF (TOWEL DISPOSABLE) ×1 IMPLANT
TRAY FAXITRON CT DISP (TRAY / TRAY PROCEDURE) ×1 IMPLANT
TUBE CONNECTING 20X1/4 (TUBING) ×1 IMPLANT
YANKAUER SUCT BULB TIP NO VENT (SUCTIONS) IMPLANT

## 2023-01-20 NOTE — Op Note (Signed)
01/20/2023  11:51 AM  PATIENT:  Kayla Ray  72 y.o. female  PRE-OPERATIVE DIAGNOSIS:  RIGHT BREAST CANCER  POST-OPERATIVE DIAGNOSIS:  RIGHT BREAST CANCER  PROCEDURE:  Procedure(s): RIGHT BREAST LUMPECTOMY WITH RADIOACTIVE SEED LOCALIZATION (Right)  SURGEON:  Surgeons and Role:    * Griselda Miner, MD - Primary  PHYSICIAN ASSISTANT:   ASSISTANTS: none   ANESTHESIA:   local and general  EBL:  15 mL   BLOOD ADMINISTERED:none  DRAINS: none   LOCAL MEDICATIONS USED:  MARCAINE     SPECIMEN:  Source of Specimen:  right breast tissue with additional lateral and deep margins  DISPOSITION OF SPECIMEN:  PATHOLOGY  COUNTS:  YES  TOURNIQUET:  * No tourniquets in log *  DICTATION: .Dragon Dictation  After informed consent was obtained the patient was brought to the operating room and placed in the supine position on the operating room table.  After adequate induction of general anesthesia the patient's right breast was prepped with ChloraPrep, allowed to dry, and draped in usual sterile manner.  An appropriate timeout was performed.  Previously an I-125 seed was placed in the outer aspect of the right breast to mark an area of invasive breast cancer.  The neoprobe was set to I-125 in the area of radioactivity was readily identified.  The area around this was infiltrated with quarter percent Marcaine.  I elected to make a vertically oriented curvilinear incision along the outer aspect of the right breast close to the cancer.  The incision was carried through the skin and subcutaneous tissue sharply with the electrocautery.  Dissection was then carried towards the radioactive seed.  Once I more closely approach the radioactive seed I then removed a circular portion of breast tissue sharply with the electrocautery around the radioactive seed while checking the area of radioactivity frequently.  Once the specimen was removed it was oriented with the appropriate paint colors.  A specimen  radiograph was obtained that showed the clip and seed to be near the center of the specimen.  I did elect to take an additional lateral and deep margin based on the image.  These were also marked appropriately.  All of the tissue was then sent to pathology for further evaluation.  Hemostasis was achieved using the Bovie electrocautery.  The wound was irrigated with saline and infiltrated with more quarter percent Marcaine.  The cavity was marked with clips.  The deep layer of the wound was then closed with layers of interrupted 3-0 Vicryl stitches.  The skin was then closed with a running 4-0 Monocryl subcuticular stitch.  Dermabond dressings were applied.  The patient tolerated the procedure well.  At the end of the case all needle sponge and instrument counts were correct.  The patient was then awakened and taken recovery in stable condition.  PLAN OF CARE: Discharge to home after PACU  PATIENT DISPOSITION:  PACU - hemodynamically stable.   Delay start of Pharmacological VTE agent (>24hrs) due to surgical blood loss or risk of bleeding: not applicable

## 2023-01-20 NOTE — Anesthesia Procedure Notes (Signed)
Procedure Name: Intubation Date/Time: 01/20/2023 11:16 AM  Performed by: Karen Kitchens, CRNAPre-anesthesia Checklist: Patient identified, Emergency Drugs available, Suction available and Patient being monitored Patient Re-evaluated:Patient Re-evaluated prior to induction Oxygen Delivery Method: Circle system utilized Preoxygenation: Pre-oxygenation with 100% oxygen Induction Type: IV induction Ventilation: Mask ventilation without difficulty Laryngoscope Size: Mac and 4 Grade View: Grade I Tube type: Oral Number of attempts: 1 Airway Equipment and Method: Stylet and Oral airway Placement Confirmation: ETT inserted through vocal cords under direct vision, positive ETCO2, breath sounds checked- equal and bilateral and CO2 detector Secured at: 21 cm Tube secured with: Tape Dental Injury: Teeth and Oropharynx as per pre-operative assessment

## 2023-01-20 NOTE — Transfer of Care (Signed)
Immediate Anesthesia Transfer of Care Note  Patient: Radio producer  Procedure(s) Performed: RIGHT BREAST LUMPECTOMY WITH RADIOACTIVE SEED LOCALIZATION (Right: Breast)  Patient Location: PACU  Anesthesia Type:General  Level of Consciousness: awake, alert , and oriented  Airway & Oxygen Therapy: Patient Spontanous Breathing and Patient connected to face mask oxygen  Post-op Assessment: Report given to RN and Post -op Vital signs reviewed and stable  Post vital signs: Reviewed and stable  Last Vitals:  Vitals Value Taken Time  BP 159/89 01/20/23 1209  Temp    Pulse 109 01/20/23 1211  Resp 15 01/20/23 1211  SpO2 97 % 01/20/23 1211  Vitals shown include unfiled device data.  Last Pain:  Vitals:   01/20/23 0848  TempSrc: Oral  PainSc: 0-No pain      Patients Stated Pain Goal: 5 (01/20/23 0848)  Complications: No notable events documented.

## 2023-01-20 NOTE — Discharge Instructions (Addendum)
  Post Anesthesia Home Care Instructions  Activity: Get plenty of rest for the remainder of the day. A responsible individual must stay with you for 24 hours following the procedure.  For the next 24 hours, DO NOT: -Drive a car -Advertising copywriter -Drink alcoholic beverages -Take any medication unless instructed by your physician -Make any legal decisions or sign important papers. Pt took 1000 mg tylenol @9am . No tylenol products till after 1pm today      Meals: Start with liquid foods such as gelatin or soup. Progress to regular foods as tolerated. Avoid greasy, spicy, heavy foods. If nausea and/or vomiting occur, drink only clear liquids until the nausea and/or vomiting subsides. Call your physician if vomiting continues.  Special Instructions/Symptoms: Your throat may feel dry or sore from the anesthesia or the breathing tube placed in your throat during surgery. If this causes discomfort, gargle with warm salt water. The discomfort should disappear within 24 hours.  If you had a scopolamine patch placed behind your ear for the management of post- operative nausea and/or vomiting:  1. The medication in the patch is effective for 72 hours, after which it should be removed.  Wrap patch in a tissue and discard in the trash. Wash hands thoroughly with soap and water. 2. You may remove the patch earlier than 72 hours if you experience unpleasant side effects which may include dry mouth, dizziness or visual disturbances. 3. Avoid touching the patch. Wash your hands with soap and water after contact with the patch.

## 2023-01-20 NOTE — Anesthesia Postprocedure Evaluation (Signed)
Anesthesia Post Note  Patient: Engineer, petroleum) Performed: RIGHT BREAST LUMPECTOMY WITH RADIOACTIVE SEED LOCALIZATION (Right: Breast)     Patient location during evaluation: PACU Anesthesia Type: General Level of consciousness: awake and alert and oriented Pain management: pain level controlled Vital Signs Assessment: post-procedure vital signs reviewed and stable Respiratory status: spontaneous breathing, nonlabored ventilation and respiratory function stable Cardiovascular status: blood pressure returned to baseline and stable Postop Assessment: no apparent nausea or vomiting Anesthetic complications: no   No notable events documented.  Last Vitals:  Vitals:   01/20/23 1230 01/20/23 1243  BP: (!) 173/96   Pulse: 99 92  Resp: (!) 22 14  Temp:    SpO2: 95% 97%    Last Pain:  Vitals:   01/20/23 1230  TempSrc:   PainSc: 0-No pain                 Hester Joslin A.

## 2023-01-20 NOTE — Interval H&P Note (Signed)
History and Physical Interval Note:  01/20/2023 10:49 AM  Kayla Ray  has presented today for surgery, with the diagnosis of RIGHT BREAST CANCER.  The various methods of treatment have been discussed with the patient and family. After consideration of risks, benefits and other options for treatment, the patient has consented to  Procedure(s): RIGHT BREAST LUMPECTOMY WITH RADIOACTIVE SEED LOCALIZATION (Right) as a surgical intervention.  The patient's history has been reviewed, patient examined, no change in status, stable for surgery.  I have reviewed the patient's chart and labs.  Questions were answered to the patient's satisfaction.     [name]

## 2023-01-20 NOTE — H&P (Signed)
REFERRING PHYSICIAN: Sabas Sous, MD PROVIDER: Lindell Noe, MD MRN: [id number] DOB: August 01, 1950 Subjective   Chief Complaint: No chief complaint on file.  History of Present Illness: Kayla Ray is a 72 y.o. female who is seen today as an office consultation for evaluation of No chief complaint on file.  We are asked to see the patient in consultation by Dr. Pamelia Hoit to evaluate her for a new right breast cancer. The patient is a 72 year old white female who recently went for a routine screening mammogram. At that time she was found to have a 7 mm mass in the upper outer quadrant of the right breast. The axilla looked normal. 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 She is otherwise in pretty good health except for some diabetes and she does not smoke. She has a family history of a sister with lymphoma.  Review of Systems: A complete review of systems was obtained from the patient. I have reviewed this information and discussed as appropriate with the patient. See HPI as well for other ROS.  ROS   Medical History: Past Medical History:  Diagnosis Date  Anxiety  Asthma, unspecified asthma severity, unspecified whether complicated, unspecified whether persistent (HHS-HCC)  Diabetes mellitus without complication (CMS/HHS-HCC)  Glaucoma (increased eye pressure)  Sleep apnea  Thyroid disease   There is no problem list on file for this patient.  Past Surgical History:  Procedure Laterality Date  BREAST EXCISIONAL BIOPSY 12/10/2022  2019  CATARACT EXTRACTION  CESAREAN SECTION  CHOLECYSTECTOMY  GLAUCOMA EYE SURGERY  HYSTERECTOMY  LENS EYE SURGERY    Allergies  Allergen Reactions  Bactrim [Sulfamethoxazole-Trimethoprim] Palpitations  States it makes her chest heavy  Glimepiride Rash  Jardiance [Empagliflozin] Dermatitis  Metformin Diarrhea   Current Outpatient Medications  on File Prior to Visit  Medication Sig Dispense Refill  albuterol (PROVENTIL) 2.5 mg /3 mL (0.083 %) nebulizer solution Take 2.5 mg by nebulization every 6 (six) hours as needed for Shortness of Breath or Wheezing  albuterol MDI, PROVENTIL, VENTOLIN, PROAIR, HFA 90 mcg/actuation inhaler Inhale 1-2 Inhalations into the lungs every 6 (six) hours as needed  atorvastatin (LIPITOR) 10 MG tablet  fluticasone furoate-vilanteroL (BREO ELLIPTA) 100-25 mcg/dose DsDv inhaler Inhale 1 Puff into the lungs once daily  levothyroxine (SYNTHROID) 50 MCG tablet Take by mouth  montelukast (SINGULAIR) 10 mg tablet Take by mouth  traZODone (DESYREL) 50 MG tablet TAKE 3 TABLETS (150 MG) OR 4 TABLETS BY MOUTH DAILY AS NEEDED AT BEDTIME  ACCU-CHEK GUIDE TEST STRIPS test strip 2 (two) times daily  bimatoprost (LUMIGAN) 0.01 % ophthalmic solution Place 1 drop into both eyes nightly (Patient not taking: Reported on 08/15/2021) 2.5 mL 12  brimonidine (ALPHAGAN) 0.2 % ophthalmic solution INSTILL 1 DROP INTO BOTH EYES TWICE A DAY (Patient not taking: Reported on 08/15/2021) 15 mL 11  dorzolamide-timolol (COSOPT PF) 2-0.5 % ophthalmic solution Place 1 drop into both eyes 2 (two) times daily 60 Blister Pack 12  dulaglutide (TRULICITY) 1.5 mg/0.5 mL subcutaneous injection USE AS DIRECTED ONCE A WEEK  RHOPRESSA 0.02 % eye drops (Patient not taking: Reported on 08/15/2021)  travoprost (TRAVATAN Z) 0.004 % Ophth ophthalmic solution Place 1 drop into both eyes at bedtime 2.5 mL 12  venlafaxine (EFFEXOR-XR) 150 MG XR capsule Take 150 mg by mouth once daily  venlafaxine (EFFEXOR-XR) 75 MG XR capsule Take 75 mg by mouth once  daily (Patient not taking: Reported on 08/15/2021)  VIIBRYD 40 mg tablet (Patient not taking: Reported on 04/12/2021)  vit C,E-Zn-coppr-lutein-zeaxan (OCUVITE LUTEIN AND ZEAXANTHIN) 60 mg-13.5 mg- 15 mg-2 mg-6 mg Cap Take by mouth   No current facility-administered medications on file prior to visit.   Family  History  Problem Relation Age of Onset  Coronary Artery Disease (Blocked arteries around heart) Mother  Diabetes Mother  Glaucoma Father  Macular degeneration Father  Coronary Artery Disease (Blocked arteries around heart) Sister  Macular degeneration Paternal Grandmother  Blindness Neg Hx    Social History   Tobacco Use  Smoking Status Never  Smokeless Tobacco Never    Social History   Socioeconomic History  Marital status: Married  Tobacco Use  Smoking status: Never  Smokeless tobacco: Never  Vaping Use  Vaping status: Never Used  Substance and Sexual Activity  Alcohol use: Yes  Comment: ocassional  Drug use: Never   Objective:  There were no vitals filed for this visit.  There is no height or weight on file to calculate BMI.  Physical Exam Vitals reviewed.  Constitutional:  General: She is not in acute distress. Appearance: Normal appearance.  HENT:  Head: Normocephalic and atraumatic.  Right Ear: External ear normal.  Left Ear: External ear normal.  Nose: Nose normal.  Mouth/Throat:  Mouth: Mucous membranes are moist.  Pharynx: Oropharynx is clear.  Eyes:  General: No scleral icterus. Extraocular Movements: Extraocular movements intact.  Conjunctiva/sclera: Conjunctivae normal.  Pupils: Pupils are equal, round, and reactive to light.  Cardiovascular:  Rate and Rhythm: Normal rate and regular rhythm.  Pulses: Normal pulses.  Heart sounds: Normal heart sounds.  Pulmonary:  Effort: Pulmonary effort is normal. No respiratory distress.  Breath sounds: Normal breath sounds.  Abdominal:  General: Bowel sounds are normal.  Palpations: Abdomen is soft.  Tenderness: There is no abdominal tenderness.  Musculoskeletal:  General: No swelling, tenderness or deformity. Normal range of motion.  Cervical back: Normal range of motion and neck supple.  Skin: General: Skin is warm and dry.  Coloration: Skin is not jaundiced.  Neurological:  General: No focal  deficit present.  Mental Status: She is alert and oriented to person, place, and time.  Psychiatric:  Mood and Affect: Mood normal.  Behavior: Behavior normal.     Breast: There is no palpable mass in either breast. There is no palpable axillary, supraclavicular, or cervical lymphadenopathy.  Labs, Imaging and Diagnostic Testing:  Assessment and Plan:   Diagnoses and all orders for this visit:  Malignant neoplasm of upper-outer quadrant of right breast in female, estrogen receptor positive (CMS/HHS-HCC)   The patient appears to have a 7 mm low-grade cancer in the upper outer quadrant of the right breast with clinically negative nodes and all favorable markers. I have discussed with her in detail the different options for treatment and at this point she favors breast conservation. She will not need a node evaluation. I have discussed with her in detail the risks and benefits of the operation as well as some of the technical aspects including use of a radioactive seed for localization and she understands and wishes to proceed. She will also meet with medical and radiation oncology to discuss adjuvant therapy. We will begin surgical scheduling.

## 2023-01-21 ENCOUNTER — Encounter (HOSPITAL_BASED_OUTPATIENT_CLINIC_OR_DEPARTMENT_OTHER): Payer: Self-pay | Admitting: General Surgery

## 2023-01-21 LAB — SURGICAL PATHOLOGY

## 2023-01-23 ENCOUNTER — Encounter: Payer: Self-pay | Admitting: *Deleted

## 2023-01-23 ENCOUNTER — Telehealth: Payer: Self-pay | Admitting: Radiation Oncology

## 2023-01-23 DIAGNOSIS — Z170 Estrogen receptor positive status [ER+]: Secondary | ICD-10-CM

## 2023-01-23 NOTE — Telephone Encounter (Signed)
Spoke to pt to schedule CON30 and SIM post-sx.

## 2023-01-26 NOTE — Progress Notes (Signed)
Patient Care Team: Georgann Housekeeper, MD as PCP - General (Internal Medicine) Serena Croissant, MD as Consulting Physician (Hematology and Oncology) Griselda Miner, MD as Consulting Physician (General Surgery) Lonie Peak, MD as Attending Physician (Radiation Oncology) Donnelly Angelica, RN as Oncology Nurse Navigator Pershing Proud, RN as Oncology Nurse Navigator  DIAGNOSIS:  Encounter Diagnosis  Name Primary?   Malignant neoplasm of upper-outer quadrant of right breast in female, estrogen receptor positive (HCC) Yes    SUMMARY OF ONCOLOGIC HISTORY: Oncology History  Malignant neoplasm of upper-outer quadrant of right breast in female, estrogen receptor positive (HCC)  12/10/2022 Initial Diagnosis   Screening mammogram detected right breast mass UOQ 10 o'clock position: 0.7 cm, axilla negative, ultrasound biopsy: Grade 1 IDC with DCIS, ER 95%, PR 95%, Ki-67 5%, HER2 2+ by IHC, FISH negative   12/18/2022 Cancer Staging   Staging form: Breast, AJCC 8th Edition - Clinical: Stage IA (cT1b, cN0, cM0, G1, ER+, PR+, HER2-) - Signed by Serena Croissant, MD on 12/18/2022 Histologic grading system: 3 grade system     CHIEF COMPLIANT: Follow-up post op  INTERVAL HISTORY: Kayla Ray is a 72 y.o. female is here because of recent diagnosis of right breast cancer. She presents to the clinic for a follow-up. Patient states surgery went well with no complaints or side effects.  ALLERGIES:  is allergic to bactrim [sulfamethoxazole-trimethoprim], glimepiride, jardiance [empagliflozin], and metformin and related.  MEDICATIONS:  Current Outpatient Medications  Medication Sig Dispense Refill   acetaminophen (TYLENOL) 325 MG tablet Take 650 mg by mouth every 6 (six) hours as needed for moderate pain.     albuterol (PROVENTIL HFA;VENTOLIN HFA) 108 (90 Base) MCG/ACT inhaler Inhale 1-2 puffs into the lungs every 6 (six) hours as needed for wheezing or shortness of breath.     albuterol (PROVENTIL) (2.5  MG/3ML) 0.083% nebulizer solution Take 2.5 mg by nebulization every 6 (six) hours as needed for wheezing or shortness of breath.     atorvastatin (LIPITOR) 10 MG tablet Take 10 mg by mouth daily.  3   Dorzolamide HCl-Timolol Mal PF 2-0.5 % SOLN Place 1 drop into both eyes 2 (two) times daily.     estazolam (PROSOM) 2 MG tablet Take 2 mg by mouth at bedtime as needed (sleep).     fluticasone (FLONASE) 50 MCG/ACT nasal spray Place 1 spray into both nostrils daily. (Patient taking differently: Place 1 spray into both nostrils daily as needed for allergies.) 16 g 11   fluticasone furoate-vilanterol (BREO ELLIPTA) 100-25 MCG/ACT AEPB Inhale 1 puff into the lungs daily. (Patient taking differently: Inhale 1 puff into the lungs daily as needed (shortness of breath).) 60 each 11   lamoTRIgine (LAMICTAL) 25 MG tablet Take 50 mg by mouth at bedtime.     levothyroxine (SYNTHROID, LEVOTHROID) 50 MCG tablet Take 50 mcg by mouth daily before breakfast.     montelukast (SINGULAIR) 10 MG tablet TAKE 1 TABLET BY MOUTH EVERYDAY AT BEDTIME 90 tablet 3   multivitamin-lutein (OCUVITE-LUTEIN) CAPS capsule Take 1 capsule by mouth daily.     NON FORMULARY Pt uses a cpap nightly     oxyCODONE (ROXICODONE) 5 MG immediate release tablet Take 1 tablet (5 mg total) by mouth every 6 (six) hours as needed for severe pain. 10 tablet 0   Travoprost, BAK Free, (TRAVATAN) 0.004 % SOLN ophthalmic solution Place 1 drop into both eyes at bedtime.     traZODone (DESYREL) 100 MG tablet Take 200 mg by mouth at bedtime.  venlafaxine XR (EFFEXOR-XR) 150 MG 24 hr capsule Take 300 mg by mouth daily with breakfast.     bimatoprost (LUMIGAN) 0.01 % SOLN 1 drop at bedtime.     No current facility-administered medications for this visit.    PHYSICAL EXAMINATION: ECOG PERFORMANCE STATUS: 1 - Symptomatic but completely ambulatory  Vitals:   01/31/23 1127  BP: 129/66  Pulse: 90  Resp: 18  Temp: (!) 97.2 F (36.2 C)  SpO2: 97%    Filed Weights   01/31/23 1127  Weight: 246 lb (111.6 kg)      LABORATORY DATA:  I have reviewed the data as listed    Latest Ref Rng & Units 01/20/2023    8:56 AM 12/18/2022   12:39 PM 02/13/2021    3:45 PM  CMP  Glucose 70 - 99 mg/dL 696  295  284   BUN 8 - 23 mg/dL 10  14  7    Creatinine 0.44 - 1.00 mg/dL 1.32  4.40  1.02   Sodium 135 - 145 mmol/L 134  136  132   Potassium 3.5 - 5.1 mmol/L 3.8  4.2  3.8   Chloride 98 - 111 mmol/L 102  103  95   CO2 22 - 32 mmol/L 24  28  25    Calcium 8.9 - 10.3 mg/dL 8.5  9.4  9.5   Total Protein 6.5 - 8.1 g/dL  6.3    Total Bilirubin 0.3 - 1.2 mg/dL  0.3    Alkaline Phos 38 - 126 U/L  77    AST 15 - 41 U/L  20    ALT 0 - 44 U/L  18      Lab Results  Component Value Date   WBC 9.7 01/10/2023   HGB 11.8 (L) 01/10/2023   HCT 37.5 01/10/2023   MCV 88.7 01/10/2023   PLT 260 01/10/2023   NEUTROABS 5.8 12/18/2022    ASSESSMENT & PLAN:  Malignant neoplasm of upper-outer quadrant of right breast in female, estrogen receptor positive (HCC) 12/10/2022:Screening mammogram detected right breast mass UOQ 10 o'clock position: 0.7 cm, axilla negative, ultrasound biopsy: Grade 1 IDC with DCIS, ER 95%, PR 95%, Ki-67 5%, HER2 2+ by IHC, FISH negative   01/20/2023: Right lumpectomy: Grade 1 IDC 0.7 cm with intermediate grade DCIS, margins negative, LVI not identified, ER 95%, PR 95%, HER2 equivocal by IHC negative by FISH ratio 1.11, Ki-67 5%  Pathology counseling: I discussed the final pathology report of the patient provided  a copy of this report. I discussed the margins as well as lymph node surgeries. We also discussed the final staging along with previously performed ER/PR and HER-2/neu testing.  Treatment plan: Adjuvant radiation (patient is contemplating on whether or not to do radiation) Antiestrogen therapy with anastrozole 1 mg daily x 5 years  If she does not do radiation then we will immediately start her on antiestrogen therapy. She  has an appointment to discuss with radiation oncology about the pros and cons of adjuvant radiation. Return to clinic in 3 months for survivorship care plan visit    No orders of the defined types were placed in this encounter.  The patient has a good understanding of the overall plan. she agrees with it. she will call with any problems that may develop before the next visit here. Total time spent: 30 mins including face to face time and time spent for planning, charting and co-ordination of care   Tamsen Meek, MD 01/31/23    I  Janan Ridge am acting as a Neurosurgeon for [employer]  I have reviewed the above documentation for accuracy and completeness, and I agree with the above.

## 2023-01-31 ENCOUNTER — Inpatient Hospital Stay: Payer: Medicare Other | Attending: Hematology and Oncology | Admitting: Hematology and Oncology

## 2023-01-31 ENCOUNTER — Other Ambulatory Visit: Payer: Self-pay

## 2023-01-31 VITALS — BP 129/66 | HR 90 | Temp 97.2°F | Resp 18 | Ht 59.0 in | Wt 246.0 lb

## 2023-01-31 DIAGNOSIS — Z170 Estrogen receptor positive status [ER+]: Secondary | ICD-10-CM | POA: Insufficient documentation

## 2023-01-31 DIAGNOSIS — C50411 Malignant neoplasm of upper-outer quadrant of right female breast: Secondary | ICD-10-CM | POA: Diagnosis not present

## 2023-01-31 DIAGNOSIS — Z79899 Other long term (current) drug therapy: Secondary | ICD-10-CM | POA: Insufficient documentation

## 2023-01-31 DIAGNOSIS — Z881 Allergy status to other antibiotic agents status: Secondary | ICD-10-CM | POA: Diagnosis not present

## 2023-01-31 NOTE — Assessment & Plan Note (Signed)
12/10/2022:Screening mammogram detected right breast mass UOQ 10 o'clock position: 0.7 cm, axilla negative, ultrasound biopsy: Grade 1 IDC with DCIS, ER 95%, PR 95%, Ki-67 5%, HER2 2+ by IHC, FISH negative   01/20/2023: Right lumpectomy: Grade 1 IDC 0.7 cm with intermediate grade DCIS, margins negative, LVI not identified, ER 95%, PR 95%, HER2 equivocal by IHC negative by FISH ratio 1.11, Ki-67 5%  Pathology counseling: I discussed the final pathology report of the patient provided  a copy of this report. I discussed the margins as well as lymph node surgeries. We also discussed the final staging along with previously performed ER/PR and HER-2/neu testing.  Treatment plan: +/- Adjuvant radiation Antiestrogen therapy with anastrozole 1 mg daily x 5 years  Return to clinic in 3 months for survivorship care plan visit

## 2023-02-11 NOTE — Progress Notes (Signed)
Location of Breast Cancer: Malignant neoplasm of upper-outer quadrant of right breast in female, estrogen receptor positive   Histology per Pathology Report:  01-20-23 FINAL MICROSCOPIC DIAGNOSIS:  A. 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 BREAST, RIGHT POSTERIOR MARGIN, EXCISION: Benign breast tissue Posterior margin negative for carcinoma  C. BREAST, RIGHT LATERAL MARGIN, EXCISION: Benign breast tissue Lateral margin negative for carcinoma  ONCOLOGY TABLE: INVASIVE CARCINOMA OF THE BREAST:  Resection Procedure: Right breast lumpectomy with additional posterior and lateral margin biopsies Specimen Laterality: Right breast Histologic Type: Ductal Histologic Grade:      Glandular (Acinar)/Tubular Differentiation: 2      Nuclear Pleomorphism: 2      Mitotic Rate: 1      Overall Grade: 1 Tumor Size: 0.7 x 0.6 x 0.5 cm Ductal Carcinoma In Situ: Present, intermediate nuclear grade without necrosis Lymphatic and/or Vascular Invasion: Not identified Treatment Effect in the Breast: No known presurgical therapy Margins: All margins negative for invasive carcinoma      Distance from Closest Margin (mm): 5 mm      Specify Closest Margin (required only if <34mm): Inferior DCIS Margins: All margins negative for DCIS      Distance from Closest Margin (mm): 7 mm      Specify Closest Margin (required only if <42mm): Inferior Regional Lymph Nodes: No lymph nodes submitted Distant Metastasis:      Distant Site(s) Involved: Not applicable Breast Biomarker Testing Performed on Previous  Biopsy:      Testing Performed on Case Number: [id number]            Estrogen Receptor: 95%, positive, strong staining            Progesterone Receptor: 95%, positive, strong staining            HER2: Equivocal with IHC.  Negative with FISH, ratio 1.11            Ki-67: 5% Pathologic Stage Classification (pTNM, AJCC 8th Edition): pT1b, pN not assigned Representative Tumor Block: A1-A3 (v4.5.0.0)   Receptor Status: ER(95%), PR (95%), Her2-neu (neg), Ki-67(5%)  Did patient present with symptoms (if so, please note symptoms) or was this found on screening mammography?: screening mammogram  Past/Anticipated interventions by surgeon, if any: 01-20-23 PRE-OPERATIVE DIAGNOSIS:  RIGHT BREAST CANCER   POST-OPERATIVE DIAGNOSIS:  RIGHT BREAST CANCER   PROCEDURE:  Procedure(s): RIGHT BREAST LUMPECTOMY WITH RADIOACTIVE SEED LOCALIZATION (Right)   SURGEON:  Surgeons and Role:    Griselda Miner, MD - Primary  Past/Anticipated interventions by medical oncology, if any:  Serena Croissant, MD 01/31/2023  Oncology History  Malignant neoplasm of upper-outer quadrant of right breast in female, estrogen receptor positive (HCC)  12/10/2022 Initial Diagnosis    Screening mammogram detected right breast mass UOQ 10 o'clock position: 0.7 cm, axilla negative, ultrasound biopsy: Grade 1 IDC with DCIS, ER 95%, PR 95%, Ki-67 5%, HER2 2+ by IHC, FISH negative    12/18/2022 Cancer Staging    Staging form: Breast, AJCC 8th Edition - Clinical: Stage IA (cT1b, cN0, cM0, G1, ER+, PR+, HER2-) - Signed by Serena Croissant, MD on 12/18/2022 Histologic grading system: 3 grade system  ASSESSMENT & PLAN:  Malignant neoplasm of upper-outer quadrant of right breast in female, estrogen receptor positive (HCC) 12/10/2022:Screening mammogram detected right breast mass UOQ 10 o'clock position: 0.7 cm, axilla negative, ultrasound biopsy: Grade 1 IDC with DCIS, ER 95%, PR 95%, Ki-67 5%, HER2 2+ by IHC, FISH negative    01/20/2023:  Right lumpectomy: Grade 1 IDC 0.7 cm with intermediate grade DCIS, margins negative, LVI not identified, ER 95%, PR 95%, HER2 equivocal by IHC negative by FISH ratio 1.11, Ki-67 5%   Pathology counseling: I discussed the final pathology report of the patient provided  a copy of this report. I discussed the margins as well as lymph node surgeries. We also discussed the final staging along with previously performed ER/PR and HER-2/neu testing.   Treatment plan: Adjuvant radiation (patient is contemplating on whether or not to do radiation) Antiestrogen therapy with anastrozole 1 mg daily x 5 years   If she does not do radiation then we will immediately start her on antiestrogen therapy. She has an appointment to discuss with radiation oncology about the pros and cons of adjuvant radiation. Return to clinic in 3 months for survivorship care plan visit    Lymphedema issues, if any:  none  Pain issues, if any: reports her breast is slightly tender due to an infection site that is being treated (right below her surgical site), this is getting better with antibiotics.   SAFETY ISSUES: Prior radiation? no Pacemaker/ICD? no Possible current pregnancy?no Is the patient on methotrexate? no  Current Complaints / other details: No major concerns and questions. 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

## 2023-02-14 ENCOUNTER — Telehealth: Payer: Self-pay | Admitting: Hematology and Oncology

## 2023-02-14 NOTE — Telephone Encounter (Signed)
Scheduled appointment per 8/2 los. Patient is aware of the made appointment.

## 2023-02-17 ENCOUNTER — Encounter: Payer: Self-pay | Admitting: Radiation Oncology

## 2023-02-17 ENCOUNTER — Telehealth: Payer: Self-pay

## 2023-02-17 NOTE — Telephone Encounter (Signed)
Rn called pt for meaningful use and nurse evaluation information. Consult note completed and routed to Dr. Basilio Cairo and [facility] for review.

## 2023-02-17 NOTE — Progress Notes (Signed)
Radiation Oncology         [phone]) [phone] ________________________________  Name: Kayla Ray MRN: [id number]  Date: 02/18/2023  DOB: 11-21-1950  Follow-Up Visit Note  Outpatient  CC: Georgann Housekeeper, MD  Serena Croissant, MD  Diagnosis:   No diagnosis found.   Stage *** (***) Right Breast UOQ, Invasive ductal carcinoma with intermediate grade DCIS, ER+ / PR+ / Her2-, Grade 1: s/p right breast lumpectomy without SLN evaluation   CHIEF COMPLAINT: Here to discuss management of right breast cancer  Narrative:  The patient returns today for follow-up.     Since her breast clinic consultation date of 12/18/22, she opted to proceed with a right breast lumpectomy without nodal biopsies on 01/20/23 under the care of Dr. Carolynne Edouard. Pathology from the procedure revealed: tumor size of 7 mm; histology of grade 1 invasive ductal carcinoma with intermediate grade DCIS; all margins negative for invasive and in situ carcinoma; margin status to invasive disease of 5 mm from the inferior margin; margin status to in situ disease of 7 mm from the inferior margin; no lymph nodes were examined;  ER status: 95% positive and PR status 95% positive, both with strong staining intensity; Proliferation marker Ki67 at 5%; Her2 status negative; Grade 1.  Post-operatively, the patient was noted to have some cellulitis in the central right breast which did not appear to be centered around the incision site. She was examined by Dr. Carolynne Edouard on 02/13/23 for this and was prescribed a course of doxycycline.    Based on Dr. Earmon Phoenix recommendation, the patient is interested in proceeding with antiestrogen therapy consisting of anastrozole x 5 years. (If she does not wish to proceed with radiation therapy after our discussion today, Dr. Pamelia Hoit will go ahead and start her on antiestrogen therapy). She will return to Dr. Pamelia Hoit in approximately 3 months to review her survivorship care plan.   Symptomatically, the patient reports: ***         ALLERGIES:  is allergic to bactrim [sulfamethoxazole-trimethoprim], glimepiride, jardiance [empagliflozin], and metformin and related.  Meds: Current Outpatient Medications  Medication Sig Dispense Refill   albuterol (PROVENTIL HFA;VENTOLIN HFA) 108 (90 Base) MCG/ACT inhaler Inhale 1-2 puffs into the lungs every 6 (six) hours as needed for wheezing or shortness of breath.     albuterol (PROVENTIL) (2.5 MG/3ML) 0.083% nebulizer solution Take 2.5 mg by nebulization every 6 (six) hours as needed for wheezing or shortness of breath.     atorvastatin (LIPITOR) 10 MG tablet Take 10 mg by mouth daily.  3   Dorzolamide HCl-Timolol Mal PF 2-0.5 % SOLN Place 1 drop into both eyes 2 (two) times daily.     estazolam (PROSOM) 2 MG tablet Take 2 mg by mouth at bedtime as needed (sleep).     fluticasone furoate-vilanterol (BREO ELLIPTA) 100-25 MCG/ACT AEPB Inhale 1 puff into the lungs daily. (Patient taking differently: Inhale 1 puff into the lungs daily as needed (shortness of breath).) 60 each 11   lamoTRIgine (LAMICTAL) 25 MG tablet Take 50 mg by mouth at bedtime.     levothyroxine (SYNTHROID, LEVOTHROID) 50 MCG tablet Take 50 mcg by mouth daily before breakfast.     montelukast (SINGULAIR) 10 MG tablet TAKE 1 TABLET BY MOUTH EVERYDAY AT BEDTIME 90 tablet 3   multivitamin-lutein (OCUVITE-LUTEIN) CAPS capsule Take 1 capsule by mouth daily.     NON FORMULARY Pt uses a cpap nightly     oxyCODONE (ROXICODONE) 5 MG immediate release tablet Take 1 tablet (5 mg total)  by mouth every 6 (six) hours as needed for severe pain. 10 tablet 0   Travoprost, BAK Free, (TRAVATAN) 0.004 % SOLN ophthalmic solution Place 1 drop into both eyes at bedtime.     traZODone (DESYREL) 100 MG tablet Take 200 mg by mouth at bedtime.     venlafaxine XR (EFFEXOR-XR) 150 MG 24 hr capsule Take 300 mg by mouth daily with breakfast.     acetaminophen (TYLENOL) 325 MG tablet Take 650 mg by mouth every 6 (six) hours as needed for moderate  pain.     bimatoprost (LUMIGAN) 0.01 % SOLN 1 drop at bedtime.     fluticasone (FLONASE) 50 MCG/ACT nasal spray Place 1 spray into both nostrils daily. (Patient taking differently: Place 1 spray into both nostrils daily as needed for allergies.) 16 g 11   No current facility-administered medications for this encounter.    Physical Findings:  vitals were not taken for this visit. .     General: Alert and oriented, in no acute distress HEENT: Head is normocephalic. Extraocular movements are intact. Oropharynx is clear. Neck: Neck is supple, no palpable cervical or supraclavicular lymphadenopathy. Heart: Regular in rate and rhythm with no murmurs, rubs, or gallops. Chest: Clear to auscultation bilaterally, with no rhonchi, wheezes, or rales. Abdomen: Soft, nontender, nondistended, with no rigidity or guarding. Extremities: No cyanosis or edema. Lymphatics: see Neck Exam Musculoskeletal: symmetric strength and muscle tone throughout. Neurologic: No obvious focalities. Speech is fluent.  Psychiatric: Judgment and insight are intact. Affect is appropriate. Breast exam reveals ***  Lab Findings: Lab Results  Component Value Date   WBC 9.7 01/10/2023   HGB 11.8 (L) 01/10/2023   HCT 37.5 01/10/2023   MCV 88.7 01/10/2023   PLT 260 01/10/2023    @LASTCHEMISTRY @  Radiographic Findings: MM Breast Surgical Specimen  Result Date: 01/20/2023 CLINICAL DATA:  Status post lumpectomy today after earlier radioactive seed localization. EXAM: SPECIMEN RADIOGRAPH OF THE RIGHT BREAST COMPARISON:  Previous exam(s). FINDINGS: Status post excision of the right breast. The radioactive seed and biopsy marker clip are present and appear completely intact within the specimen. Findings discussed with the OR staff during the procedure. IMPRESSION: Specimen radiograph of the right breast. Electronically Signed   By: Bary Richard M.D.   On: 01/20/2023 11:43    Impression/Plan: We discussed adjuvant  radiotherapy today.  I recommend *** in order to ***.  I reviewed the logistics, benefits, risks, and potential side effects of this treatment in detail. Risks may include but not necessary be limited to acute and late injury tissue in the radiation fields such as skin irritation (change in color/pigmentation, itching, dryness, pain, peeling). She may experience fatigue. We also discussed possible risk of long term cosmetic changes or scar tissue. There is also a smaller risk for lung toxicity, ***cardiac toxicity, ***brachial plexopathy, ***lymphedema, ***musculoskeletal changes, ***rib fragility or ***induction of a second malignancy, ***late chronic non-healing soft tissue wound.    The patient asked good questions which I answered to her satisfaction. She is enthusiastic about proceeding with treatment. A consent form has been *** signed and placed in her chart.  A total of *** medically necessary complex treatment devices will be fabricated and supervised by me: *** fields with MLCs for custom blocks to protect heart, and lungs;  and, a Vac-lok. MORE COMPLEX DEVICES MAY BE MADE IN DOSIMETRY FOR FIELD IN FIELD BEAMS FOR DOSE HOMOGENEITY.  I have requested : 3D Simulation which is medically necessary to give adequate dose to  at risk tissues while sparing lungs and heart.  I have requested a DVH of the following structures: lungs, heart, *** lumpectomy cavity.    The patient will receive *** Gy in *** fractions to the *** with *** fields.  This will be *** followed by a boost.  On date of service, in total, I spent *** minutes on this encounter. Patient was seen in person.  _____________________________________   Lonie Peak, MD  This document serves as a record of services personally performed by Lonie Peak, MD. It was created on her behalf by Neena Rhymes, a trained medical scribe. The creation of this record is based on the scribe's personal observations and the provider's statements to them.  This document has been checked and approved by the attending provider.

## 2023-02-18 ENCOUNTER — Ambulatory Visit
Admission: RE | Admit: 2023-02-18 | Discharge: 2023-02-18 | Disposition: A | Discharge status: Home / Self Care | Payer: Medicare Other | Source: Ambulatory Visit | Attending: Radiation Oncology | Admitting: Radiation Oncology

## 2023-02-18 ENCOUNTER — Other Ambulatory Visit: Payer: Self-pay

## 2023-02-18 ENCOUNTER — Ambulatory Visit: Payer: Medicare Other | Admitting: Radiation Oncology

## 2023-02-18 VITALS — BP 144/70 | HR 78 | Resp 17

## 2023-02-18 DIAGNOSIS — Z170 Estrogen receptor positive status [ER+]: Secondary | ICD-10-CM | POA: Diagnosis not present

## 2023-02-18 DIAGNOSIS — C50411 Malignant neoplasm of upper-outer quadrant of right female breast: Secondary | ICD-10-CM

## 2023-02-18 DIAGNOSIS — Z79890 Hormone replacement therapy (postmenopausal): Secondary | ICD-10-CM | POA: Diagnosis not present

## 2023-02-18 DIAGNOSIS — Z7951 Long term (current) use of inhaled steroids: Secondary | ICD-10-CM | POA: Diagnosis not present

## 2023-02-18 DIAGNOSIS — Z79899 Other long term (current) drug therapy: Secondary | ICD-10-CM | POA: Diagnosis not present

## 2023-02-25 ENCOUNTER — Telehealth: Payer: Self-pay | Admitting: *Deleted

## 2023-02-25 ENCOUNTER — Encounter: Payer: Self-pay | Admitting: *Deleted

## 2023-02-25 ENCOUNTER — Other Ambulatory Visit: Payer: Self-pay | Admitting: *Deleted

## 2023-02-25 DIAGNOSIS — H10413 Chronic giant papillary conjunctivitis, bilateral: Secondary | ICD-10-CM | POA: Diagnosis not present

## 2023-02-25 DIAGNOSIS — E119 Type 2 diabetes mellitus without complications: Secondary | ICD-10-CM | POA: Diagnosis not present

## 2023-02-25 DIAGNOSIS — H0102A Squamous blepharitis right eye, upper and lower eyelids: Secondary | ICD-10-CM | POA: Diagnosis not present

## 2023-02-25 DIAGNOSIS — C50411 Malignant neoplasm of upper-outer quadrant of right female breast: Secondary | ICD-10-CM

## 2023-02-25 DIAGNOSIS — H401131 Primary open-angle glaucoma, bilateral, mild stage: Secondary | ICD-10-CM | POA: Diagnosis not present

## 2023-02-25 MED ORDER — ANASTROZOLE 1 MG PO TABS: 1.0000 mg | ORAL_TABLET | Freq: Every day | ORAL | 3 refills | Status: DC

## 2023-02-25 NOTE — Telephone Encounter (Signed)
Spoke with patient regarding anastrozole prescription. She has decided to forgo xrt and start anastrozole.  Informed her I would send the rx to her pharmacy which I verified. Patient verbalized understanding.

## 2023-03-05 DIAGNOSIS — K080 Exfoliation of teeth due to systemic causes: Secondary | ICD-10-CM | POA: Diagnosis not present

## 2023-03-21 DIAGNOSIS — F3341 Major depressive disorder, recurrent, in partial remission: Secondary | ICD-10-CM | POA: Diagnosis not present

## 2023-03-25 DIAGNOSIS — Z6841 Body Mass Index (BMI) 40.0 and over, adult: Secondary | ICD-10-CM | POA: Diagnosis not present

## 2023-03-25 DIAGNOSIS — F331 Major depressive disorder, recurrent, moderate: Secondary | ICD-10-CM | POA: Diagnosis not present

## 2023-03-25 DIAGNOSIS — E119 Type 2 diabetes mellitus without complications: Secondary | ICD-10-CM | POA: Diagnosis not present

## 2023-03-25 DIAGNOSIS — C50411 Malignant neoplasm of upper-outer quadrant of right female breast: Secondary | ICD-10-CM | POA: Diagnosis not present

## 2023-03-28 ENCOUNTER — Emergency Department (HOSPITAL_COMMUNITY): Payer: Medicare Other

## 2023-03-28 ENCOUNTER — Other Ambulatory Visit: Payer: Self-pay

## 2023-03-28 ENCOUNTER — Emergency Department (HOSPITAL_COMMUNITY)
Admission: EM | Admit: 2023-03-28 | Discharge: 2023-03-28 | Disposition: A | Discharge status: Home / Self Care | Payer: Medicare Other | Attending: Emergency Medicine | Admitting: Emergency Medicine

## 2023-03-28 DIAGNOSIS — R0602 Shortness of breath: Secondary | ICD-10-CM | POA: Diagnosis not present

## 2023-03-28 DIAGNOSIS — E039 Hypothyroidism, unspecified: Secondary | ICD-10-CM | POA: Insufficient documentation

## 2023-03-28 DIAGNOSIS — J45909 Unspecified asthma, uncomplicated: Secondary | ICD-10-CM | POA: Diagnosis not present

## 2023-03-28 DIAGNOSIS — Z1152 Encounter for screening for COVID-19: Secondary | ICD-10-CM | POA: Insufficient documentation

## 2023-03-28 DIAGNOSIS — Z7951 Long term (current) use of inhaled steroids: Secondary | ICD-10-CM | POA: Diagnosis not present

## 2023-03-28 DIAGNOSIS — R059 Cough, unspecified: Secondary | ICD-10-CM | POA: Diagnosis not present

## 2023-03-28 DIAGNOSIS — E119 Type 2 diabetes mellitus without complications: Secondary | ICD-10-CM | POA: Diagnosis not present

## 2023-03-28 DIAGNOSIS — J40 Bronchitis, not specified as acute or chronic: Secondary | ICD-10-CM | POA: Diagnosis not present

## 2023-03-28 DIAGNOSIS — Z853 Personal history of malignant neoplasm of breast: Secondary | ICD-10-CM | POA: Diagnosis not present

## 2023-03-28 DIAGNOSIS — R9389 Abnormal findings on diagnostic imaging of other specified body structures: Secondary | ICD-10-CM | POA: Diagnosis not present

## 2023-03-28 LAB — SARS CORONAVIRUS 2 BY RT PCR: SARS Coronavirus 2 by RT PCR: NEGATIVE

## 2023-03-28 MED ORDER — HYDROCOD POLI-CHLORPHE POLI ER 10-8 MG/5ML PO SUER
5.0000 mL | Freq: Once | ORAL | Status: AC
  Administered 2023-03-28: 5 mL via ORAL
  Filled 2023-03-28: qty 5

## 2023-03-28 MED ORDER — HYDROCOD POLI-CHLORPHE POLI ER 10-8 MG/5ML PO SUER: 5.0000 mL | Freq: Two times a day (BID) | ORAL | 0 refills | Status: AC | PRN

## 2023-03-28 NOTE — ED Provider Notes (Signed)
[place] EMERGENCY DEPARTMENT AT [facility] Provider Note  CSN: [id number] Arrival date & time: 03/28/23 0140  Chief Complaint(s) Shortness of Breath  HPI Kayla Ray is a 72 y.o. female With a past medical history listed below including asthma who presents to the emergency department with several days of severe cough with shortness of breath preceded by several days of URI symptoms.  Patient was seen by her primary care provider yesterday and prescribed antibiotics for possible bronchitis/pneumonia due to minimal relief with breathing treatments at home.  She denies any fevers.  No nausea or vomiting.  No overt chest pain.  No other physical complaints.    Shortness of Breath   Past Medical History Past Medical History:  Diagnosis Date   Arthritis    Asthma    Breast cancer (HCC)    Depression    patient says it is for depression   Diabetes mellitus without complication (HCC)    Hypothyroidism    Sleep apnea    Patient Active Problem List   Diagnosis Date Noted   Family history of gene mutation 12/19/2022   Malignant neoplasm of upper-outer quadrant of right breast in female, estrogen receptor positive (HCC) 12/16/2022   History of postmenopausal HRT 10/26/2021   History of hysterectomy 10/26/2021   Body mass index (BMI) 45.0-49.9, adult (HCC) 10/25/2021   Glaucoma 10/25/2021   Hardening of the aorta (main artery of the heart) (HCC) 10/25/2021   Hyperglycemia due to type 2 diabetes mellitus (HCC) 10/25/2021   Hypothyroidism 10/25/2021   Insomnia 10/25/2021   Major depression in full remission (HCC) 10/25/2021   Obstructive sleep apnea (adult) (pediatric) 10/25/2021   Mixed hyperlipidemia 10/25/2021   Intermittent asthma 07/13/2021   Hx of intrinsic asthma 03/30/2021   Home Medication(s) Prior to Admission medications   Medication Sig Start Date End Date Taking? Authorizing Provider  chlorpheniramine-HYDROcodone (TUSSIONEX) 10-8 MG/5ML Take 5 mLs by  mouth every 12 (twelve) hours as needed for up to 7 days for cough. 03/28/23 04/04/23 Yes Azaylia Fong, Amadeo Garnet, MD  albuterol (PROVENTIL HFA;VENTOLIN HFA) 108 (90 Base) MCG/ACT inhaler Inhale 1-2 puffs into the lungs every 6 (six) hours as needed for wheezing or shortness of breath.    [provider]  albuterol (PROVENTIL) (2.5 MG/3ML) 0.083% nebulizer solution Take 2.5 mg by nebulization every 6 (six) hours as needed for wheezing or shortness of breath.    [provider]  anastrozole (ARIMIDEX) 1 MG tablet Take 1 tablet (1 mg total) by mouth daily. 02/25/23   Serena Croissant, MD  atorvastatin (LIPITOR) 10 MG tablet Take 10 mg by mouth daily. 11/03/17   [provider]  Dorzolamide HCl-Timolol Mal PF 2-0.5 % SOLN Place 1 drop into both eyes 2 (two) times daily. 12/09/22   [provider]  doxycycline (MONODOX) 100 MG capsule Take 100 mg by mouth 2 (two) times daily. 02/13/23   [provider]  estazolam (PROSOM) 2 MG tablet Take 2 mg by mouth at bedtime as needed (sleep). 09/24/22   [provider]  fluticasone furoate-vilanterol (BREO ELLIPTA) 100-25 MCG/ACT AEPB Inhale 1 puff into the lungs daily. Patient taking differently: Inhale 1 puff into the lungs daily as needed (shortness of breath). 08/14/21   Omar Person, MD  lamoTRIgine (LAMICTAL) 25 MG tablet Take 50 mg by mouth at bedtime.    [provider]  levothyroxine (SYNTHROID, LEVOTHROID) 50 MCG tablet Take 50 mcg by mouth daily before breakfast.    [provider]  montelukast (  SINGULAIR) 10 MG tablet TAKE 1 TABLET BY MOUTH EVERYDAY AT BEDTIME 08/26/22   Omar Person, MD  multivitamin-lutein [facility]) CAPS capsule Take 1 capsule by mouth daily.    [provider]  NON FORMULARY Pt uses a cpap nightly    [provider]  oxyCODONE (ROXICODONE) 5 MG immediate release tablet Take 1 tablet (5 mg total) by mouth every 6 (six) hours as needed for  severe pain. 01/20/23   [name], MD  Travoprost, BAK Free, (TRAVATAN) 0.004 % SOLN ophthalmic solution Place 1 drop into both eyes at bedtime. 12/02/22   [provider]  traZODone (DESYREL) 100 MG tablet Take 200 mg by mouth at bedtime.    [provider]  venlafaxine XR (EFFEXOR-XR) 150 MG 24 hr capsule Take 300 mg by mouth daily with breakfast.    Georgann Housekeeper, MD                                                                                                                                    Allergies Bactrim [sulfamethoxazole-trimethoprim], Glimepiride, Jardiance [empagliflozin], and Metformin and related  Review of Systems Review of Systems  Respiratory:  Positive for shortness of breath.    As noted in HPI  Physical Exam Vital Signs  I have reviewed the triage vital signs BP (!) 152/55   Pulse 93   Temp 97.7 F (36.5 C) (Oral)   Resp 19   SpO2 94%   Physical Exam Vitals reviewed.  Constitutional:      General: She is not in acute distress.    Appearance: She is well-developed. She is not diaphoretic.  HENT:     Head: Normocephalic and atraumatic.     Nose: Nose normal.  Eyes:     General: No scleral icterus.       Right eye: No discharge.        Left eye: No discharge.     Conjunctiva/sclera: Conjunctivae normal.     Pupils: Pupils are equal, round, and reactive to light.  Cardiovascular:     Rate and Rhythm: Normal rate and regular rhythm.     Heart sounds: No murmur heard.    No friction rub. No gallop.  Pulmonary:     Effort: Pulmonary effort is normal. No respiratory distress.     Breath sounds: Normal breath sounds. No stridor. No wheezing, rhonchi or rales.  Abdominal:     General: There is no distension.     Palpations: Abdomen is soft.     Tenderness: There is no abdominal tenderness.  Musculoskeletal:        General: No tenderness.     Cervical back: Normal range of motion and neck supple.  Skin:    General: Skin is warm and  dry.     Findings: No erythema or rash.  Neurological:     Mental Status: She is alert and oriented to person, place, and  time.     ED Results and Treatments Labs (all labs ordered are listed, but only abnormal results are displayed) Labs Reviewed  SARS CORONAVIRUS 2 BY RT PCR                                                                                                                         EKG  EKG Interpretation Date/Time:  Friday March 28 2023 01:51:38 EDT Ventricular Rate:  119 PR Interval:  138 QRS Duration:  138 QT Interval:  318 QTC Calculation: 448 R Axis:   25  Text Interpretation: Sinus tachycardia Right bundle branch block Probable inferior infarct, old No acute changes Confirmed by Drema Pry [phone]) on 03/28/2023 2:04:27 AM       Radiology No results found.  Medications Ordered in ED Medications  chlorpheniramine-HYDROcodone (TUSSIONEX) 10-8 MG/5ML suspension 5 mL (5 mLs Oral Given 03/28/23 0329)   Procedures Procedures  (including critical care time) Medical Decision Making / ED Course   Medical Decision Making Amount and/or Complexity of Data Reviewed Labs: ordered. Decision-making details documented in ED Course. Radiology: ordered and independent interpretation performed. Decision-making details documented in ED Course. ECG/medicine tests: ordered and independent interpretation performed. Decision-making details documented in ED Course.    Patient presents with viral symptoms for 1 week. adequate oral hydration. Rest of history as above.  Patient appears well. No signs of toxicity, patient is interactive. No hypoxia, tachypnea or other signs of respiratory distress. No sign of clinical dehydration. Lung exam clear. Rest of exam as above.  On my evaluation, chest x-ray did not reveal evidence of obvious pneumonia.    Most consistent with viral illness  COVID was negative.  Coughing improved after Tussionex, so did her shortness of  breath.  No breathing treatments needed given lack of bronchospastic findings.  After several hours without radiology read, patient updated.  With shared decision making, we chose to discharge patient home despite pending read as it will not change management.  Discussed symptomatic treatment with the patient and they will follow closely with their PCP.      Final Clinical Impression(s) / ED Diagnoses Final diagnoses:  Bronchitis   The patient appears reasonably screened and/or stabilized for discharge and I doubt any other medical condition or other [facility] requiring further screening, evaluation, or treatment in the ED at this time. I have discussed the findings, Dx and Tx plan with the patient/family who expressed understanding and agree(s) with the plan. Discharge instructions discussed at length. The patient/family was given strict return precautions who verbalized understanding of the instructions. No further questions at time of discharge.  Disposition: Discharge  Condition: Good  ED Discharge Orders          Ordered    chlorpheniramine-HYDROcodone (TUSSIONEX) 10-8 MG/5ML  Every 12 hours PRN        03/28/23 0510              Follow Up: Georgann Housekeeper, MD [address] [place] [address] [phone]  Call  to schedule an appointment for close follow up    This chart was dictated using voice recognition software.  Despite best efforts to proofread,  errors can occur which can change the documentation meaning.    Nira Conn, MD 03/28/23 [phone]

## 2023-03-28 NOTE — ED Triage Notes (Signed)
Pt c/o SOB that has worsened over the course of 5 days.

## 2023-04-01 DIAGNOSIS — K080 Exfoliation of teeth due to systemic causes: Secondary | ICD-10-CM | POA: Diagnosis not present

## 2023-04-08 ENCOUNTER — Other Ambulatory Visit: Payer: Self-pay | Admitting: Primary Care

## 2023-04-08 ENCOUNTER — Encounter: Payer: Self-pay | Admitting: Primary Care

## 2023-04-08 ENCOUNTER — Ambulatory Visit: Payer: Medicare Other | Admitting: Primary Care

## 2023-04-08 VITALS — BP 110/68 | HR 84 | Ht 59.0 in | Wt 247.0 lb

## 2023-04-08 DIAGNOSIS — J209 Acute bronchitis, unspecified: Secondary | ICD-10-CM | POA: Insufficient documentation

## 2023-04-08 DIAGNOSIS — G4733 Obstructive sleep apnea (adult) (pediatric): Secondary | ICD-10-CM

## 2023-04-08 NOTE — Assessment & Plan Note (Addendum)
-   Stable; Not currently exacerbated. Lungs clear on exam. Denies wheezing/shortness of breath - Continue BREO one puff daily as needed  - Received annual flu vaccine last week

## 2023-04-08 NOTE — Patient Instructions (Addendum)
Recommendations: Start over the counter Claritin (loratadine) - take 10mg  daily x 1 week Use Flonase nasal spray daily  Take Robitussin 5ml every 4-6 hours for 5-7 days for cough  Use BREO as needed once daily for shortness of breath/chest tightness/wheezing or cough   Follow-up: 6 months with [facility] NP or sooner if needed

## 2023-04-08 NOTE — Progress Notes (Signed)
@Patient  ID: Kayla Ray, female    DOB: Jan 01, 1951, 72 y.o.   MRN: [id number]  Chief Complaint  Patient presents with   Follow-up    Pt is here for Asthma F/U visit. Pt recently had Bronchitis 3 weeks ago. Pt states she feels better.    Referring provider: Georgann Housekeeper, MD  HPI:  [phone] with chart history of adult-onset asthma, DM not requiring insulin, depression/anxiety, glaucoma, OSA on CPAP - actively follows with Dr. Earl Gala with [facility], had sinus surgery in the remote past for a deviated septum.  Her husband says that she often gets asthma exacerbations as she's getting over a cold, especially if she's not on top of her bronchodilators via nebulizer. When she has an exacerbation she coughs quite a bit, productive. Usually prednisone is helpful but less helpful during this acute illness. Tried azithromycin as well. DOE during an attack to less than a block.  She was then recently at [facility] for asthma exacerbation. She does think the doxycycline may have been helpful. Last dose of doxy and prednisone today. Productive cough is better.   She has some rhinorrhea but no sinonasal congestion, doesn't report frequent trouble with sinonasal congestion. In past she had more trouble with GERD and still takes prilosec periodically for it. Her asthma does flare up in spring/fall typically.    Inhalers tried previously: Albuterol, perhaps breo but she is unsure  Her father had lung cancer. Sister has COPD.  She has always done administrative work, Hotel manager. Have previously lived in [place], [place], [place], [place]. No pets/birds at home.     Interval HPI: Last seen 6/7 continuing breo on as needed basis, flonase, SABA/flutter valve. Asked RT from [facility] to help assess mask leak issues with CPAP but no one ever came. Still having a lot of leak with mas.   Heat/humidity not doing her any favors. Has double dosed breo 2-3x. They were spread out and not for more  than a day or so. Last time was last night. Still using singulair daily, rinsing mouth after using inhaler.   No courses of ABX or steroids since last visit.   Has minor postnasal drainage. No overt heartburn that she's noticed.  ----------------------------------------------------------------------- Flonase, prn nonsedating antihistamine encouraged last visit for PND  Otherwise pertinent review of systems is negative.     04/08/2023- interim hx  Patient presents today for regular OV. Former Patient of Dr. Thora Lance, followed for asthma. She was seen in ED on 03/28/23 for bronchitis. CXR negative for pneumonia. Etiology felt to be viral, she was given tussionex for her cough.   She is doing better. Cough has cleared up. Feels phelegm in the back of her throat. No chest tightness, shortness of breath or wheezing. Using BREO only as needed, has not required recently.   Allergies  Allergen Reactions   Bactrim [Sulfamethoxazole-Trimethoprim] Rash    Chest tightness   Glimepiride Rash   Jardiance [Empagliflozin] Other (See Comments)   Metformin And Related Diarrhea    Immunization History  Administered Date(s) Administered   Influenza Split 04/22/2017, 03/29/2018, 04/03/2020, 04/18/2021   Influenza, High Dose Seasonal PF 03/11/2019   PFIZER(Purple Top)SARS-COV-2 Vaccination 07/08/2019, 07/26/2019, 05/12/2020   Pneumococcal Conjugate-13 05/08/2017   Pneumococcal Polysaccharide-23 07/06/2018   Td 05/08/2017   Zoster Recombinant(Shingrix) 04/07/2019   Zoster, Live 04/07/2019, 11/23/2019    Past Medical History:  Diagnosis Date   Arthritis    Asthma    Breast cancer (HCC)    Depression  patient says it is for depression   Diabetes mellitus without complication (HCC)    Hypothyroidism    Sleep apnea     Tobacco History: Social History   Tobacco Use  Smoking Status Never  Smokeless Tobacco Never   Counseling given: Not Answered   Outpatient Medications Prior to Visit   Medication Sig Dispense Refill   albuterol (PROVENTIL HFA;VENTOLIN HFA) 108 (90 Base) MCG/ACT inhaler Inhale 1-2 puffs into the lungs every 6 (six) hours as needed for wheezing or shortness of breath.     albuterol (PROVENTIL) (2.5 MG/3ML) 0.083% nebulizer solution Take 2.5 mg by nebulization every 6 (six) hours as needed for wheezing or shortness of breath.     anastrozole (ARIMIDEX) 1 MG tablet Take 1 tablet (1 mg total) by mouth daily. 90 tablet 3   atorvastatin (LIPITOR) 10 MG tablet Take 10 mg by mouth daily.  3   Dorzolamide HCl-Timolol Mal PF 2-0.5 % SOLN Place 1 drop into both eyes 2 (two) times daily.     estazolam (PROSOM) 2 MG tablet Take 2 mg by mouth at bedtime as needed (sleep).     fluticasone furoate-vilanterol (BREO ELLIPTA) 100-25 MCG/ACT AEPB Inhale 1 puff into the lungs daily. (Patient taking differently: Inhale 1 puff into the lungs daily as needed (shortness of breath).) 60 each 11   lamoTRIgine (LAMICTAL) 25 MG tablet Take 50 mg by mouth at bedtime.     levothyroxine (SYNTHROID, LEVOTHROID) 50 MCG tablet Take 50 mcg by mouth daily before breakfast.     montelukast (SINGULAIR) 10 MG tablet TAKE 1 TABLET BY MOUTH EVERYDAY AT BEDTIME 90 tablet 3   multivitamin-lutein (OCUVITE-LUTEIN) CAPS capsule Take 1 capsule by mouth daily.     NON FORMULARY Pt uses a cpap nightly     Travoprost, BAK Free, (TRAVATAN) 0.004 % SOLN ophthalmic solution Place 1 drop into both eyes at bedtime.     traZODone (DESYREL) 100 MG tablet Take 200 mg by mouth at bedtime.     venlafaxine XR (EFFEXOR-XR) 150 MG 24 hr capsule Take 300 mg by mouth daily with breakfast.     doxycycline (MONODOX) 100 MG capsule Take 100 mg by mouth 2 (two) times daily.     oxyCODONE (ROXICODONE) 5 MG immediate release tablet Take 1 tablet (5 mg total) by mouth every 6 (six) hours as needed for severe pain. 10 tablet 0   No facility-administered medications prior to visit.   Review of Systems  Review of Systems   Constitutional: Negative.   HENT:  Positive for congestion and postnasal drip.   Respiratory:  Negative for cough, chest tightness and wheezing.   Cardiovascular: Negative.    Physical Exam  BP 110/68 (BP Location: Left Arm, Cuff Size: Normal)   Pulse 84   Ht 4\' 11"  (1.499 m)   Wt 247 lb (112 kg)   SpO2 97%   BMI 49.89 kg/m  Physical Exam Constitutional:      General: She is not in acute distress.    Appearance: Normal appearance. She is obese. She is not ill-appearing.  HENT:     Head: Normocephalic and atraumatic.     Right Ear: Tympanic membrane normal.     Left Ear: Tympanic membrane normal.     Mouth/Throat:     Mouth: Mucous membranes are moist.     Pharynx: Oropharynx is clear.  Cardiovascular:     Rate and Rhythm: Normal rate and regular rhythm.  Pulmonary:     Effort: Pulmonary effort is  normal.     Breath sounds: Normal breath sounds.     Comments: CTA Musculoskeletal:        General: Normal range of motion.  Skin:    General: Skin is warm and dry.  Neurological:     General: No focal deficit present.     Mental Status: She is alert and oriented to person, place, and time. Mental status is at baseline.  Psychiatric:        Mood and Affect: Mood normal.        Behavior: Behavior normal.        Thought Content: Thought content normal.        Judgment: Judgment normal.      Lab Results:  CBC    Component Value Date/Time   WBC 9.7 01/10/2023 1000   RBC 4.23 01/10/2023 1000   HGB 11.8 (L) 01/10/2023 1000   HGB 11.6 (L) 12/18/2022 1239   HCT 37.5 01/10/2023 1000   PLT 260 01/10/2023 1000   PLT 294 12/18/2022 1239   MCV 88.7 01/10/2023 1000   MCH 27.9 01/10/2023 1000   MCHC 31.5 01/10/2023 1000   RDW 14.9 01/10/2023 1000   LYMPHSABS 2.9 12/18/2022 1239   MONOABS 1.3 (H) 12/18/2022 1239   EOSABS 0.3 12/18/2022 1239   BASOSABS 0.1 12/18/2022 1239    BMET    Component Value Date/Time   NA 134 (L) 01/20/2023 0856   K 3.8 01/20/2023 0856   CL  102 01/20/2023 0856   CO2 24 01/20/2023 0856   GLUCOSE 147 (H) 01/20/2023 0856   BUN 10 01/20/2023 0856   CREATININE 0.76 01/20/2023 0856   CREATININE 0.72 12/18/2022 1239   CALCIUM 8.5 (L) 01/20/2023 0856   GFRNONAA >60 01/20/2023 0856   GFRNONAA >60 12/18/2022 1239   GFRAA >60 12/03/2017 2302    BNP No results found for: "BNP"  ProBNP No results found for: "PROBNP"  Imaging: DG Chest 2 View  Result Date: 03/28/2023 CLINICAL DATA:  Cough and shortness of breath. EXAM: CHEST - 2 VIEW COMPARISON:  02/13/2021 FINDINGS: Chronic elevation of the right hemidiaphragm. No focal airspace disease or pulmonary edema. Heart size is within normal limits and stable. Trachea is midline. Negative for a pneumothorax. No large pleural effusion. No acute bone abnormality. IMPRESSION: 1. No acute cardiopulmonary disease. Electronically Signed   By: Richarda Overlie M.D.   On: 03/28/2023 06:01     Assessment & Plan:   Mild intermittent asthma - Stable; Not currently exacerbated. Lungs clear on exam. Denies wheezing/shortness of breath - Continue BREO one puff daily as needed  - Received annual flu vaccine last week   Acute bronchitis - Seen in ED on 03/28/23 for bronchitis, viral etiology. CXR negative. Cough has improved, she still has some PND/ upper airway congestion. Lung are clear on exam. Recommend she take Claritin (loratadine) 10mg  daily x 1 week, use Flonase nasal spray daily and Robitussin 5ml as needed every 4-6 hours for cough.   Obstructive sleep apnea (adult) (pediatric) - Patient reports compliance with CPAP nightly, managed by Dr. Earl Gala with [facility]   Follow-up 6 months with [facility] NP or sooner if needed   Glenford Bayley, NP 04/08/2023

## 2023-04-08 NOTE — Assessment & Plan Note (Addendum)
-   Patient reports compliance with CPAP nightly, managed by Dr. Earl Gala with [facility]

## 2023-04-08 NOTE — Progress Notes (Unsigned)
@Patient  ID: Kayla Ray, female    DOB: 25-Dec-1950, 72 y.o.   MRN: [id number]  No chief complaint on file.   Referring provider: No ref. provider found  HPI:  72 year old female, never smoked. PMH significant for hx asthma. Patient of Dr. Thora Lance, seen for initial consult on 02/19/21 for dyspnea on exertion and cough. Maintained on Singulair 10mg  daily. Ordered for PFTs. Given flutter valve.  If still has cough, consider methacholine challenge and/or CT chest   Previous [facility] pulmonary encounter: 02/19/21 - Dr. Thora Lance  70yF with chart history of adult-onset asthma, DM not requiring insulin, depression/anxiety, glaucoma, OSA on CPAP - actively follows with Dr. Earl Gala with [facility], had sinus surgery in the remote past for a deviated septum.   Her husband says that she often gets asthma exacerbations as she's getting over a cold, especially if she's not on top of her bronchodilators via nebulizer. When she has an exacerbation she coughs quite a bit, productive. Usually prednisone is helpful but less helpful during this acute illness. Tried azithromycin as well. DOE during an attack to less than a block.   She was then recently at [facility] for asthma exacerbation. She does think the doxycycline may have been helpful. Last dose of doxy and prednisone today. Productive cough is better.    She has some rhinorrhea but no sinonasal congestion, doesn't report frequent trouble with sinonasal congestion. In past she had more trouble with GERD and still takes prilosec periodically for it. Her asthma does flare up in spring/fall typically.      Inhalers tried previously: Albuterol, perhaps breo but she is unsure   Her father had lung cancer. Sister has COPD.   She has always done administrative work, Hotel manager. Have previously lived in [place], [place], [place], [place]. No pets/birds at home.    03/30/2021 Patient presents today for 1 month follow-up DOE. She reports mild  dyspnea with exertion. Cough has resolved. Pulmonary function testing showed normal lung function. She gets winded walking more than 150-256ft. Symptoms have been present since she had bronchitis in August.She does not notice any wheezing or chest tightness symptoms. CXR on 02/13/21 showed no active cardiopulmonary disease. FENO was normal today.    05/10/21- Dr. Thora Lance  70yF with chart history of adult-onset asthma, DM not requiring insulin, depression/anxiety, glaucoma, OSA on CPAP - actively follows with Dr. Earl Gala with [facility], had sinus surgery in the remote past for a deviated septum.  Her husband says that she often gets asthma exacerbations as she's getting over a cold, especially if she's not on top of her bronchodilators via nebulizer. When she has an exacerbation she coughs quite a bit, productive. Usually prednisone is helpful but less helpful during this acute illness. Tried azithromycin as well. DOE during an attack to less than a block.  She was then recently at [facility] for asthma exacerbation. She does think the doxycycline may have been helpful. Last dose of doxy and prednisone today. Productive cough is better.   She has some rhinorrhea but no sinonasal congestion, doesn't report frequent trouble with sinonasal congestion. In past she had more trouble with GERD and still takes prilosec periodically for it. Her asthma does flare up in spring/fall typically.    Inhalers tried previously: Albuterol, perhaps breo but she is unsure  Her father had lung cancer. Sister has COPD.  She has always done administrative work, Hotel manager. Have previously lived in [place], [place], [place], [place]. No pets/birds at  home.     Interval HPI: Seen by Buelah Manis in clinic 9/30. Consideration given to stress testing given more prominent DOE at that time. Had methacholine challenge with borderline AHR 10/11. DOE apparently has improved somewhat. Was still on montelukast at  time of methacholine challenge.  She says she's basically back to baseline with regard to DOE. Hard to quantify DOE due to competing hip pain. Cough is better. No fever, CP, orthopnea.   Seems like major trigger is transition to fall season.   Otherwise pertinent review of systems is negative.   07/13/2021 Patient presents today for acute visit/cough. During her last visit with Dr. Thora Lance in November she was started on Symbicort .  She feels her lung capacity is greater since starting Symbicort but experiences a cough after taking medication. Cough is occasionally productive with clear mucus. She needs to use albuterol inhaler after using Symbicort to calm her cough down. She is using spacer. She has no other acute complaints or respiratory symptoms. No sick contacts. Afebrile.    04/08/2023 - Interim hx  Patient presents today for follow   ACT 12   Seen in ED on September 27th for bronchitis.          Pulmonary function testing: 03/22/21- FVC 1.96 (80%), FEV1 1.65 (89%), ratio 84, TLC 85%, DLCOcor 15.96 (95%) Normal pulmonary function   03/30/2021 FENO 19            Allergies  Allergen Reactions   Bactrim [Sulfamethoxazole-Trimethoprim] Rash    Chest tightness   Glimepiride Rash   Jardiance [Empagliflozin] Other (See Comments)   Metformin And Related Diarrhea    Immunization History  Administered Date(s) Administered   Influenza Split 04/22/2017, 03/29/2018, 04/03/2020, 04/18/2021   Influenza, High Dose Seasonal PF 03/11/2019   PFIZER(Purple Top)SARS-COV-2 Vaccination 07/08/2019, 07/26/2019, 05/12/2020   Pneumococcal Conjugate-13 05/08/2017   Pneumococcal Polysaccharide-23 07/06/2018   Td 05/08/2017   Zoster Recombinant(Shingrix) 04/07/2019   Zoster, Live 04/07/2019, 11/23/2019    Past Medical History:  Diagnosis Date   Arthritis    Asthma    Breast cancer (HCC)    Depression    patient says it is for depression   Diabetes mellitus without  complication (HCC)    Hypothyroidism    Sleep apnea     Tobacco History: Social History   Tobacco Use  Smoking Status Never  Smokeless Tobacco Never   Counseling given: Not Answered   Outpatient Medications Prior to Visit  Medication Sig Dispense Refill   albuterol (PROVENTIL HFA;VENTOLIN HFA) 108 (90 Base) MCG/ACT inhaler Inhale 1-2 puffs into the lungs every 6 (six) hours as needed for wheezing or shortness of breath.     albuterol (PROVENTIL) (2.5 MG/3ML) 0.083% nebulizer solution Take 2.5 mg by nebulization every 6 (six) hours as needed for wheezing or shortness of breath.     anastrozole (ARIMIDEX) 1 MG tablet Take 1 tablet (1 mg total) by mouth daily. 90 tablet 3   atorvastatin (LIPITOR) 10 MG tablet Take 10 mg by mouth daily.  3   Dorzolamide HCl-Timolol Mal PF 2-0.5 % SOLN Place 1 drop into both eyes 2 (two) times daily.     estazolam (PROSOM) 2 MG tablet Take 2 mg by mouth at bedtime as needed (sleep).     fluticasone furoate-vilanterol (BREO ELLIPTA) 100-25 MCG/ACT AEPB Inhale 1 puff into the lungs daily. (Patient taking differently: Inhale 1 puff into the lungs daily as needed (shortness of breath).) 60 each 11   lamoTRIgine (LAMICTAL) 25 MG  tablet Take 50 mg by mouth at bedtime.     levothyroxine (SYNTHROID, LEVOTHROID) 50 MCG tablet Take 50 mcg by mouth daily before breakfast.     montelukast (SINGULAIR) 10 MG tablet TAKE 1 TABLET BY MOUTH EVERYDAY AT BEDTIME 90 tablet 3   multivitamin-lutein (OCUVITE-LUTEIN) CAPS capsule Take 1 capsule by mouth daily.     NON FORMULARY Pt uses a cpap nightly     Travoprost, BAK Free, (TRAVATAN) 0.004 % SOLN ophthalmic solution Place 1 drop into both eyes at bedtime.     traZODone (DESYREL) 100 MG tablet Take 200 mg by mouth at bedtime.     venlafaxine XR (EFFEXOR-XR) 150 MG 24 hr capsule Take 300 mg by mouth daily with breakfast.     No facility-administered medications prior to visit.      Review of Systems  Review of  Systems   Physical Exam  There were no vitals taken for this visit. Physical Exam   Lab Results:  CBC    Component Value Date/Time   WBC 9.7 01/10/2023 1000   RBC 4.23 01/10/2023 1000   HGB 11.8 (L) 01/10/2023 1000   HGB 11.6 (L) 12/18/2022 1239   HCT 37.5 01/10/2023 1000   PLT 260 01/10/2023 1000   PLT 294 12/18/2022 1239   MCV 88.7 01/10/2023 1000   MCH 27.9 01/10/2023 1000   MCHC 31.5 01/10/2023 1000   RDW 14.9 01/10/2023 1000   LYMPHSABS 2.9 12/18/2022 1239   MONOABS 1.3 (H) 12/18/2022 1239   EOSABS 0.3 12/18/2022 1239   BASOSABS 0.1 12/18/2022 1239    BMET    Component Value Date/Time   NA 134 (L) 01/20/2023 0856   K 3.8 01/20/2023 0856   CL 102 01/20/2023 0856   CO2 24 01/20/2023 0856   GLUCOSE 147 (H) 01/20/2023 0856   BUN 10 01/20/2023 0856   CREATININE 0.76 01/20/2023 0856   CREATININE 0.72 12/18/2022 1239   CALCIUM 8.5 (L) 01/20/2023 0856   GFRNONAA >60 01/20/2023 0856   GFRNONAA >60 12/18/2022 1239   GFRAA >60 12/03/2017 2302    BNP No results found for: "BNP"  ProBNP No results found for: "PROBNP"  Imaging: DG Chest 2 View  Result Date: 03/28/2023 CLINICAL DATA:  Cough and shortness of breath. EXAM: CHEST - 2 VIEW COMPARISON:  02/13/2021 FINDINGS: Chronic elevation of the right hemidiaphragm. No focal airspace disease or pulmonary edema. Heart size is within normal limits and stable. Trachea is midline. Negative for a pneumothorax. No large pleural effusion. No acute bone abnormality. IMPRESSION: 1. No acute cardiopulmonary disease. Electronically Signed   By: Richarda Overlie M.D.   On: 03/28/2023 06:01     Assessment & Plan:   No problem-specific Assessment & Plan notes found for this encounter.     Glenford Bayley, NP 04/08/2023

## 2023-04-08 NOTE — Assessment & Plan Note (Signed)
-   Seen in ED on 03/28/23 for bronchitis, viral etiology. CXR negative. Cough has improved, she still has some PND/ upper airway congestion. Lung are clear on exam. Recommend she take Claritin (loratadine) 10mg  daily x 1 week, use Flonase nasal spray daily and Robitussin 5ml as needed every 4-6 hours for cough.

## 2023-05-06 ENCOUNTER — Inpatient Hospital Stay: Payer: Medicare Other | Attending: Hematology and Oncology | Admitting: Adult Health

## 2023-05-22 DIAGNOSIS — C50411 Malignant neoplasm of upper-outer quadrant of right female breast: Secondary | ICD-10-CM | POA: Diagnosis not present

## 2023-05-22 DIAGNOSIS — Z170 Estrogen receptor positive status [ER+]: Secondary | ICD-10-CM | POA: Diagnosis not present

## 2023-05-26 ENCOUNTER — Encounter: Payer: Self-pay | Admitting: *Deleted

## 2023-05-27 DIAGNOSIS — K080 Exfoliation of teeth due to systemic causes: Secondary | ICD-10-CM | POA: Diagnosis not present

## 2023-06-02 ENCOUNTER — Encounter: Payer: Self-pay | Admitting: *Deleted

## 2023-06-03 ENCOUNTER — Telehealth: Payer: Self-pay | Admitting: Adult Health

## 2023-06-03 NOTE — Telephone Encounter (Signed)
Scheduled appointment per scheduling message. Patient was offered 12/31 but the patient declined. Patient was scheduled for 07/07/22 with Lillard Anes. Patient is aware of the made appointments.

## 2023-06-04 DIAGNOSIS — G4733 Obstructive sleep apnea (adult) (pediatric): Secondary | ICD-10-CM | POA: Diagnosis not present

## 2023-06-11 DIAGNOSIS — G4733 Obstructive sleep apnea (adult) (pediatric): Secondary | ICD-10-CM | POA: Diagnosis not present

## 2023-07-08 ENCOUNTER — Inpatient Hospital Stay: Payer: Medicare Other | Attending: Hematology and Oncology | Admitting: Adult Health

## 2023-07-09 DIAGNOSIS — H10413 Chronic giant papillary conjunctivitis, bilateral: Secondary | ICD-10-CM | POA: Diagnosis not present

## 2023-07-09 DIAGNOSIS — H401131 Primary open-angle glaucoma, bilateral, mild stage: Secondary | ICD-10-CM | POA: Diagnosis not present

## 2023-07-09 DIAGNOSIS — Z961 Presence of intraocular lens: Secondary | ICD-10-CM | POA: Diagnosis not present

## 2023-07-09 DIAGNOSIS — E119 Type 2 diabetes mellitus without complications: Secondary | ICD-10-CM | POA: Diagnosis not present

## 2023-07-09 DIAGNOSIS — H0102A Squamous blepharitis right eye, upper and lower eyelids: Secondary | ICD-10-CM | POA: Diagnosis not present

## 2023-07-12 DIAGNOSIS — G4733 Obstructive sleep apnea (adult) (pediatric): Secondary | ICD-10-CM | POA: Diagnosis not present

## 2023-07-30 DIAGNOSIS — E782 Mixed hyperlipidemia: Secondary | ICD-10-CM | POA: Diagnosis not present

## 2023-07-30 DIAGNOSIS — Z23 Encounter for immunization: Secondary | ICD-10-CM | POA: Diagnosis not present

## 2023-07-30 DIAGNOSIS — F325 Major depressive disorder, single episode, in full remission: Secondary | ICD-10-CM | POA: Diagnosis not present

## 2023-07-30 DIAGNOSIS — E039 Hypothyroidism, unspecified: Secondary | ICD-10-CM | POA: Diagnosis not present

## 2023-07-30 DIAGNOSIS — R413 Other amnesia: Secondary | ICD-10-CM | POA: Diagnosis not present

## 2023-07-30 DIAGNOSIS — G4700 Insomnia, unspecified: Secondary | ICD-10-CM | POA: Diagnosis not present

## 2023-07-30 DIAGNOSIS — J4530 Mild persistent asthma, uncomplicated: Secondary | ICD-10-CM | POA: Diagnosis not present

## 2023-07-30 DIAGNOSIS — E119 Type 2 diabetes mellitus without complications: Secondary | ICD-10-CM | POA: Diagnosis not present

## 2023-08-05 ENCOUNTER — Other Ambulatory Visit: Payer: Self-pay | Admitting: Family Medicine

## 2023-08-05 DIAGNOSIS — F03A0 Unspecified dementia, mild, without behavioral disturbance, psychotic disturbance, mood disturbance, and anxiety: Secondary | ICD-10-CM

## 2023-08-12 DIAGNOSIS — G4733 Obstructive sleep apnea (adult) (pediatric): Secondary | ICD-10-CM | POA: Diagnosis not present

## 2023-08-29 ENCOUNTER — Ambulatory Visit (HOSPITAL_BASED_OUTPATIENT_CLINIC_OR_DEPARTMENT_OTHER)
Admission: RE | Admit: 2023-08-29 | Discharge: 2023-08-29 | Disposition: A | Discharge status: Home / Self Care | Payer: Medicare Other | Source: Ambulatory Visit | Attending: Family Medicine | Admitting: Family Medicine

## 2023-08-29 DIAGNOSIS — F03A0 Unspecified dementia, mild, without behavioral disturbance, psychotic disturbance, mood disturbance, and anxiety: Secondary | ICD-10-CM | POA: Insufficient documentation

## 2023-08-29 DIAGNOSIS — G9389 Other specified disorders of brain: Secondary | ICD-10-CM | POA: Diagnosis not present

## 2023-08-29 DIAGNOSIS — F0390 Unspecified dementia without behavioral disturbance: Secondary | ICD-10-CM | POA: Diagnosis not present

## 2023-08-29 DIAGNOSIS — R9089 Other abnormal findings on diagnostic imaging of central nervous system: Secondary | ICD-10-CM | POA: Diagnosis not present

## 2023-08-29 MED ORDER — GADOBUTROL 1 MMOL/ML IV SOLN
10.0000 mL | Freq: Once | INTRAVENOUS | Status: AC | PRN
  Administered 2023-08-29: 10 mL via INTRAVENOUS
  Filled 2023-08-29: qty 10

## 2023-09-07 ENCOUNTER — Other Ambulatory Visit: Payer: Medicare Other

## 2023-09-09 DIAGNOSIS — G4733 Obstructive sleep apnea (adult) (pediatric): Secondary | ICD-10-CM | POA: Diagnosis not present

## 2023-09-30 DIAGNOSIS — K080 Exfoliation of teeth due to systemic causes: Secondary | ICD-10-CM | POA: Diagnosis not present

## 2023-10-07 ENCOUNTER — Encounter: Payer: Self-pay | Admitting: Primary Care

## 2023-10-07 ENCOUNTER — Ambulatory Visit: Payer: Medicare Other | Admitting: Primary Care

## 2023-10-07 VITALS — BP 118/60 | HR 86 | Temp 98.1°F | Ht 59.0 in | Wt 242.0 lb

## 2023-10-07 DIAGNOSIS — G4733 Obstructive sleep apnea (adult) (pediatric): Secondary | ICD-10-CM | POA: Diagnosis not present

## 2023-10-07 DIAGNOSIS — J4520 Mild intermittent asthma, uncomplicated: Secondary | ICD-10-CM

## 2023-10-07 MED ORDER — FLUTICASONE PROPIONATE 50 MCG/ACT NA SUSP: 1.0000 | Freq: Every day | NASAL | 2 refills | Status: DC

## 2023-10-07 MED ORDER — MONTELUKAST SODIUM 10 MG PO TABS: 10.0000 mg | ORAL_TABLET | Freq: Every day | ORAL | 3 refills | Status: AC

## 2023-10-07 MED ORDER — ALBUTEROL SULFATE HFA 108 (90 BASE) MCG/ACT IN AERS: 1.0000 | INHALATION_SPRAY | Freq: Four times a day (QID) | RESPIRATORY_TRACT | 2 refills | Status: AC | PRN

## 2023-10-07 MED ORDER — FLUTICASONE FUROATE-VILANTEROL 100-25 MCG/ACT IN AEPB: 1.0000 | INHALATION_SPRAY | Freq: Every day | RESPIRATORY_TRACT | 3 refills | Status: AC

## 2023-10-07 NOTE — Patient Instructions (Addendum)
 -  OBSTRUCTIVE SLEEP APNEA: Obstructive sleep apnea is a condition where your breathing stops and starts repeatedly during sleep. You are managing this well with your CPAP machine, which you use every night without issues. Continue to follow with Dr. Newell Coral office for management.   -MILD INTERMITTENT ASTHMA: Asthma is a condition that affects your airways and can cause difficulty breathing. Your asthma is well-controlled with minimal symptoms. You use Breo as needed and have a rescue inhaler available. We will refill your fluticasone nasal spray and ensure your Breo and albuterol inhalers are available for use as needed.  -ELEVATED RIGHT HEMIDIAPHRAGM: An elevated right hemidiaphragm means that one side of your diaphragm is higher than the other, which was noted on a previous chest x-ray. It is currently not causing any symptoms. We discussed a sniff test but decided to defer it. We recommend an annual chest x-ray in September to monitor this condition. Please report any new symptoms such as shortness of breath or abdominal pain.  -FALL RISK: You recently had a fall in the shower due to a slick floor but did not sustain any injuries. To prevent future falls, we advise getting a shower mat and considering a shower chair for additional safety.  INSTRUCTIONS: Please schedule an annual chest x-ray in September to monitor the elevation of your right hemidiaphragm. Report any new symptoms such as shortness of breath or abdominal pain. Ensure you have your fluticasone nasal spray, Breo, and albuterol inhalers available. Consider obtaining a shower mat and possibly a shower chair to prevent future falls.  Follow-up 6 months with Dr. Delton Coombes or Dr. Judeth Horn (former Dr. Thora Lance patient- 30 mins slot)

## 2023-10-07 NOTE — Progress Notes (Signed)
 @Patient  ID: Kayla Ray, female    DOB: 1951-03-11, 73 y.o.   MRN: [id number]  Chief Complaint  Patient presents with   Follow-up    Denies sob or cough    Referring provider: Georgann Housekeeper, MD  HPI: 73 year old female, never smoked. PMH significant for hx asthma. Patient of Dr. Thora Lance, seen for initial consult on 02/19/21 for dyspnea on exertion and cough. Maintained on Singulair 10mg  daily. Ordered for PFTs. Given flutter valve. If still has cough, consider methacholine challenge and/or CT chest    10/07/2023 Discussed the use of AI scribe software for clinical note transcription with the patient, who gave verbal consent to proceed.  History of Present Illness   Kayla Ray is a 73 year old female with sleep apnea and mild intermittent asthma who presents for follow-up care.  She has sleep apnea and uses her CPAP machine every night without issues. CPAP being managed by Dr. Earl Gala, who has since retired. She had a checkup earlier this year with another provider in the office.   She has mild intermittent asthma, for which she uses Breo as needed and a rescue inhaler. Over the past month, she used her rescue inhaler once and her nasal spray once. Her asthma symptoms are well-controlled, with no chest tightness, shortness of breath, or wheezing in her daily activities. She takes Singulair daily and uses Breo as needed, though she has not needed it recently except during seasonal changes. She mentioned a need for a refill on her nasal spray, which she believes is fluticasone.  A chest x-ray from last fall showed normal heart size, midline trachea, and no signs of pneumothorax, pleural effusion, airspace disease, pneumonia, or pulmonary edema. However, there was an elevation of the right hemidiaphragm. No current symptoms such as shortness of breath or abdominal pain.  In terms of her social history, she recently fell in the shower due to a slick floor but did not sustain any  injuries. She plans to get a shower mat to prevent future falls. No neuromuscular issues such as tripping, falling, or dropping things.      Allergies  Allergen Reactions   Bactrim [Sulfamethoxazole-Trimethoprim] Rash    Chest tightness   Glimepiride Rash   Jardiance [Empagliflozin] Other (See Comments)   Metformin And Related Diarrhea    Immunization History  Administered Date(s) Administered   Fluad Quad(high Dose 65+) 04/01/2023   Influenza Split 04/22/2017, 03/29/2018, 04/03/2020, 04/18/2021   Influenza, High Dose Seasonal PF 03/11/2019   PFIZER(Purple Top)SARS-COV-2 Vaccination 07/08/2019, 07/26/2019, 05/12/2020   Pneumococcal Conjugate-13 05/08/2017   Pneumococcal Polysaccharide-23 07/06/2018   Td 05/08/2017   Zoster Recombinant(Shingrix) 04/07/2019   Zoster, Live 04/07/2019, 11/23/2019    Past Medical History:  Diagnosis Date   Arthritis    Asthma    Breast cancer (HCC)    Depression    patient says it is for depression   Diabetes mellitus without complication (HCC)    Hypothyroidism    Sleep apnea     Tobacco History: Social History   Tobacco Use  Smoking Status Never  Smokeless Tobacco Never   Counseling given: Not Answered   Outpatient Medications Prior to Visit  Medication Sig Dispense Refill   albuterol (PROVENTIL) (2.5 MG/3ML) 0.083% nebulizer solution Take 2.5 mg by nebulization every 6 (six) hours as needed for wheezing or shortness of breath.     anastrozole (ARIMIDEX) 1 MG tablet Take 1 tablet (1 mg total) by mouth daily. 90 tablet 3   atorvastatin (  LIPITOR) 10 MG tablet Take 10 mg by mouth daily.  3   Dorzolamide HCl-Timolol Mal PF 2-0.5 % SOLN Place 1 drop into both eyes 2 (two) times daily.     estazolam (PROSOM) 2 MG tablet Take 2 mg by mouth at bedtime as needed (sleep).     lamoTRIgine (LAMICTAL) 25 MG tablet Take 50 mg by mouth at bedtime.     levothyroxine (SYNTHROID, LEVOTHROID) 50 MCG tablet Take 50 mcg by mouth daily before breakfast.      multivitamin-lutein (OCUVITE-LUTEIN) CAPS capsule Take 1 capsule by mouth daily.     NON FORMULARY Pt uses a cpap nightly     Travoprost, BAK Free, (TRAVATAN) 0.004 % SOLN ophthalmic solution Place 1 drop into both eyes at bedtime.     traZODone (DESYREL) 100 MG tablet Take 200 mg by mouth at bedtime.     venlafaxine XR (EFFEXOR-XR) 150 MG 24 hr capsule Take 300 mg by mouth daily with breakfast.     albuterol (PROVENTIL HFA;VENTOLIN HFA) 108 (90 Base) MCG/ACT inhaler Inhale 1-2 puffs into the lungs every 6 (six) hours as needed for wheezing or shortness of breath.     fluticasone furoate-vilanterol (BREO ELLIPTA) 100-25 MCG/ACT AEPB Inhale 1 puff into the lungs daily. (Patient taking differently: Inhale 1 puff into the lungs daily as needed (shortness of breath).) 60 each 11   montelukast (SINGULAIR) 10 MG tablet TAKE 1 TABLET BY MOUTH EVERYDAY AT BEDTIME 90 tablet 3   No facility-administered medications prior to visit.   Review of Systems  Review of Systems  Constitutional: Negative.   HENT: Negative.    Respiratory: Negative.    Cardiovascular: Negative.     Physical Exam  BP 118/60 (BP Location: Right Arm, Patient Position: Sitting, Cuff Size: Large)   Pulse 86   Temp 98.1 F (36.7 C) (Oral)   Ht 4\' 11"  (1.499 m)   Wt 242 lb (109.8 kg)   SpO2 100%   BMI 48.88 kg/m  Physical Exam Constitutional:      Appearance: Normal appearance. She is not ill-appearing.  HENT:     Head: Normocephalic and atraumatic.  Cardiovascular:     Rate and Rhythm: Normal rate and regular rhythm.  Pulmonary:     Effort: Pulmonary effort is normal.     Breath sounds: Normal breath sounds. No wheezing, rhonchi or rales.  Musculoskeletal:        General: Normal range of motion.  Skin:    General: Skin is warm and dry.  Neurological:     General: No focal deficit present.     Mental Status: She is alert and oriented to person, place, and time. Mental status is at baseline.  Psychiatric:         Mood and Affect: Mood normal.        Behavior: Behavior normal.        Thought Content: Thought content normal.        Judgment: Judgment normal.      Lab Results:  CBC    Component Value Date/Time   WBC 9.7 01/10/2023 1000   RBC 4.23 01/10/2023 1000   HGB 11.8 (L) 01/10/2023 1000   HGB 11.6 (L) 12/18/2022 1239   HCT 37.5 01/10/2023 1000   PLT 260 01/10/2023 1000   PLT 294 12/18/2022 1239   MCV 88.7 01/10/2023 1000   MCH 27.9 01/10/2023 1000   MCHC 31.5 01/10/2023 1000   RDW 14.9 01/10/2023 1000   LYMPHSABS 2.9 12/18/2022 1239  MONOABS 1.3 (H) 12/18/2022 1239   EOSABS 0.3 12/18/2022 1239   BASOSABS 0.1 12/18/2022 1239    BMET    Component Value Date/Time   NA 134 (L) 01/20/2023 0856   K 3.8 01/20/2023 0856   CL 102 01/20/2023 0856   CO2 24 01/20/2023 0856   GLUCOSE 147 (H) 01/20/2023 0856   BUN 10 01/20/2023 0856   CREATININE 0.76 01/20/2023 0856   CREATININE 0.72 12/18/2022 1239   CALCIUM 8.5 (L) 01/20/2023 0856   GFRNONAA >60 01/20/2023 0856   GFRNONAA >60 12/18/2022 1239   GFRAA >60 12/03/2017 2302    BNP No results found for: "BNP"  ProBNP No results found for: "PROBNP"  Imaging: No results found.   Assessment & Plan:   1. Mild intermittent asthma without complication (Primary)  2. OSA on CPAP   Assessment and Plan    Obstructive Sleep Apnea - Managed with CPAP therapy by Dr. Newell Coral office. Uses CPAP nightly without issues.  Mild Intermittent Asthma Well-controlled with minimal symptoms. Uses Breo as needed and has a rescue inhaler available. No recent exacerbations. - Continue Singulair 10mg  at bedtime  - Refill fluticasone nasal spray. - Ensure Breo and albuterol inhalers are available for use as needed.  Elevated Right Hemidiaphragm Noted on previous chest x-ray. Asymptomatic. Sniff test discussed but deferred. Annual chest x-ray recommended. - Order annual chest x-ray in September to monitor diaphragm elevation. -  Instruct to report any new symptoms such as shortness of breath or abdominal pain.  Fall Risk Recent mechanical fall in shower. No injuries or recurrent falls. No neuromuscular issues. - Advise obtaining a shower mat to prevent falls. - Consider a shower chair for additional safety.      Glenford Bayley, NP 10/07/2023

## 2023-10-22 DIAGNOSIS — H8113 Benign paroxysmal vertigo, bilateral: Secondary | ICD-10-CM | POA: Diagnosis not present

## 2023-10-22 DIAGNOSIS — Z6841 Body Mass Index (BMI) 40.0 and over, adult: Secondary | ICD-10-CM | POA: Diagnosis not present

## 2023-10-31 ENCOUNTER — Telehealth (HOSPITAL_BASED_OUTPATIENT_CLINIC_OR_DEPARTMENT_OTHER): Payer: Self-pay

## 2023-10-31 ENCOUNTER — Ambulatory Visit (HOSPITAL_BASED_OUTPATIENT_CLINIC_OR_DEPARTMENT_OTHER): Payer: Medicare Other | Admitting: Obstetrics & Gynecology

## 2023-10-31 NOTE — Telephone Encounter (Signed)
 Called patient she stated thought she had appt  with another doctor somewhere else .Patient rescheduled.

## 2023-10-31 NOTE — Telephone Encounter (Signed)
 Called patient she stated she thought  she had appointment was with another doctor somewhere else.

## 2023-11-02 DIAGNOSIS — G4733 Obstructive sleep apnea (adult) (pediatric): Secondary | ICD-10-CM | POA: Diagnosis not present

## 2023-11-12 DIAGNOSIS — H10413 Chronic giant papillary conjunctivitis, bilateral: Secondary | ICD-10-CM | POA: Diagnosis not present

## 2023-11-12 DIAGNOSIS — Z961 Presence of intraocular lens: Secondary | ICD-10-CM | POA: Diagnosis not present

## 2023-11-12 DIAGNOSIS — H401131 Primary open-angle glaucoma, bilateral, mild stage: Secondary | ICD-10-CM | POA: Diagnosis not present

## 2023-11-12 DIAGNOSIS — H0102A Squamous blepharitis right eye, upper and lower eyelids: Secondary | ICD-10-CM | POA: Diagnosis not present

## 2023-11-18 DIAGNOSIS — F3341 Major depressive disorder, recurrent, in partial remission: Secondary | ICD-10-CM | POA: Diagnosis not present

## 2023-12-03 DIAGNOSIS — G4733 Obstructive sleep apnea (adult) (pediatric): Secondary | ICD-10-CM | POA: Diagnosis not present

## 2023-12-09 DIAGNOSIS — K080 Exfoliation of teeth due to systemic causes: Secondary | ICD-10-CM | POA: Diagnosis not present

## 2023-12-11 DIAGNOSIS — G4733 Obstructive sleep apnea (adult) (pediatric): Secondary | ICD-10-CM | POA: Diagnosis not present

## 2024-01-02 DIAGNOSIS — G4733 Obstructive sleep apnea (adult) (pediatric): Secondary | ICD-10-CM | POA: Diagnosis not present

## 2024-01-10 DIAGNOSIS — G4733 Obstructive sleep apnea (adult) (pediatric): Secondary | ICD-10-CM | POA: Diagnosis not present

## 2024-01-27 ENCOUNTER — Encounter (HOSPITAL_BASED_OUTPATIENT_CLINIC_OR_DEPARTMENT_OTHER): Payer: Self-pay

## 2024-02-10 DIAGNOSIS — G4733 Obstructive sleep apnea (adult) (pediatric): Secondary | ICD-10-CM | POA: Diagnosis not present

## 2024-02-10 DIAGNOSIS — F325 Major depressive disorder, single episode, in full remission: Secondary | ICD-10-CM | POA: Diagnosis not present

## 2024-02-10 DIAGNOSIS — E1139 Type 2 diabetes mellitus with other diabetic ophthalmic complication: Secondary | ICD-10-CM | POA: Diagnosis not present

## 2024-02-10 DIAGNOSIS — E039 Hypothyroidism, unspecified: Secondary | ICD-10-CM | POA: Diagnosis not present

## 2024-02-10 DIAGNOSIS — R300 Dysuria: Secondary | ICD-10-CM | POA: Diagnosis not present

## 2024-02-10 DIAGNOSIS — G4700 Insomnia, unspecified: Secondary | ICD-10-CM | POA: Diagnosis not present

## 2024-02-18 DIAGNOSIS — Z0000 Encounter for general adult medical examination without abnormal findings: Secondary | ICD-10-CM | POA: Diagnosis not present

## 2024-03-02 ENCOUNTER — Ambulatory Visit (HOSPITAL_BASED_OUTPATIENT_CLINIC_OR_DEPARTMENT_OTHER): Admitting: Obstetrics & Gynecology

## 2024-03-03 ENCOUNTER — Emergency Department (HOSPITAL_BASED_OUTPATIENT_CLINIC_OR_DEPARTMENT_OTHER)

## 2024-03-03 ENCOUNTER — Emergency Department (HOSPITAL_BASED_OUTPATIENT_CLINIC_OR_DEPARTMENT_OTHER)
Admission: EM | Admit: 2024-03-03 | Discharge: 2024-03-03 | Disposition: A | Discharge status: Home / Self Care | Attending: Emergency Medicine | Admitting: Emergency Medicine

## 2024-03-03 ENCOUNTER — Other Ambulatory Visit: Payer: Self-pay

## 2024-03-03 DIAGNOSIS — M25422 Effusion, left elbow: Secondary | ICD-10-CM | POA: Diagnosis not present

## 2024-03-03 DIAGNOSIS — M25532 Pain in left wrist: Secondary | ICD-10-CM | POA: Diagnosis not present

## 2024-03-03 DIAGNOSIS — W19XXXA Unspecified fall, initial encounter: Secondary | ICD-10-CM | POA: Diagnosis not present

## 2024-03-03 DIAGNOSIS — M1812 Unilateral primary osteoarthritis of first carpometacarpal joint, left hand: Secondary | ICD-10-CM | POA: Diagnosis not present

## 2024-03-03 DIAGNOSIS — M25522 Pain in left elbow: Secondary | ICD-10-CM | POA: Diagnosis not present

## 2024-03-03 NOTE — ED Triage Notes (Signed)
 Patient states pain to left arm from wrist to elbow. States she fell on her arm in June but can't remember any other injury.

## 2024-03-03 NOTE — Discharge Instructions (Addendum)
 Take ibuprofen 600 mg at home as needed every 8 hours for pain.  Limit activity that increases pain to the the elbow.  Call Ortho for further evaluation of pain at your earliest convenience, the information to call has been attached.  If pain worsens or new symptoms arise return to the ED for further evaluation.

## 2024-03-03 NOTE — ED Notes (Signed)
 Pt d/c instructions, medications, and follow-up care reviewed with pt. Pt verbalized understanding and had no further questions at time of d/c. Pt CA&Ox4, ambulatory, and in NAD at time of d/c

## 2024-03-03 NOTE — ED Provider Notes (Signed)
 [place] EMERGENCY DEPARTMENT AT [facility] Provider Note   CSN: [id number] Arrival date & time: 03/03/24  1612     Patient presents with: Elbow Pain   Kayla Ray is a 73 y.o. female.  73 year old female presents to ED with complaints of left elbow pain x 7 weeks.  Patient reports she had a fall while on vacation in [place] in her hotel room while going to bed approximately 7 weeks ago.  Patient reports it was a mechanical fall and she landed on her elbow.  Patient reports pain at the elbow and left wrist ever since and has not gone away since the injury.  Patient is concern for fracture.  Patient denies any loss of strength, weakness, loss of sensation in the left extremity.  Patient denies any clavicle pain or loss of ROM.  Patient significant medical history for type 2 diabetes.     Prior to Admission medications   Medication Sig Start Date End Date Taking? Authorizing Provider  albuterol  (PROVENTIL ) (2.5 MG/3ML) 0.083% nebulizer solution Take 2.5 mg by nebulization every 6 (six) hours as needed for wheezing or shortness of breath.    [provider]  albuterol  (VENTOLIN  HFA) 108 (90 Base) MCG/ACT inhaler Inhale 1-2 puffs into the lungs every 6 (six) hours as needed for wheezing or shortness of breath. 10/07/23   Hope Almarie ORN, NP  anastrozole  (ARIMIDEX ) 1 MG tablet Take 1 tablet (1 mg total) by mouth daily. 02/25/23   Gudena, Vinay, MD  atorvastatin (LIPITOR) 10 MG tablet Take 10 mg by mouth daily. 11/03/17   [provider]  Dorzolamide HCl-Timolol Mal PF 2-0.5 % SOLN Place 1 drop into both eyes 2 (two) times daily. 12/09/22   [provider]  estazolam (PROSOM) 2 MG tablet Take 2 mg by mouth at bedtime as needed (sleep). 09/24/22   [provider]  fluticasone  (FLONASE ) 50 MCG/ACT nasal spray Place 1 spray into both nostrils daily. 10/07/23   Hope Almarie ORN, NP  fluticasone  furoate-vilanterol (BREO ELLIPTA ) 100-25 MCG/ACT AEPB  Inhale 1 puff into the lungs daily. 10/07/23   Hope Almarie ORN, NP  lamoTRIgine (LAMICTAL) 25 MG tablet Take 50 mg by mouth at bedtime.    [provider]  levothyroxine (SYNTHROID, LEVOTHROID) 50 MCG tablet Take 50 mcg by mouth daily before breakfast.    [provider]  montelukast  (SINGULAIR ) 10 MG tablet Take 1 tablet (10 mg total) by mouth at bedtime. 10/07/23   Hope Almarie ORN, NP  multivitamin-lutein [facility]) CAPS capsule Take 1 capsule by mouth daily.    [provider]  NON FORMULARY Pt uses a cpap nightly    [provider]  Travoprost, BAK Free, (TRAVATAN) 0.004 % SOLN ophthalmic solution Place 1 drop into both eyes at bedtime. 12/02/22   [provider]  traZODone (DESYREL) 100 MG tablet Take 200 mg by mouth at bedtime.    [provider]  venlafaxine XR (EFFEXOR-XR) 150 MG 24 hr capsule Take 300 mg by mouth daily with breakfast.    Husain, Karrar, MD    Allergies: Bactrim [sulfamethoxazole-trimethoprim], Glimepiride, Jardiance [empagliflozin], and Metformin and related    Review of Systems  Musculoskeletal:  Positive for arthralgias.  All other systems reviewed and are negative.   Updated Vital Signs BP (!) 141/101   Pulse 79   Temp 98.1 F (36.7 C)   Resp 16   SpO2 100%   Physical Exam Vitals and nursing note reviewed.  Constitutional:  Appearance: Normal appearance.  HENT:     Head: Normocephalic and atraumatic.  Eyes:     Extraocular Movements: Extraocular movements intact.     Pupils: Pupils are equal, round, and reactive to light.  Cardiovascular:     Rate and Rhythm: Normal rate.  Pulmonary:     Effort: Pulmonary effort is normal. No respiratory distress.  Abdominal:     Tenderness: There is no guarding.  Musculoskeletal:        General: Tenderness present. No swelling, deformity or signs of injury. Normal range of motion.     Right lower leg: No edema.     Left lower leg: No edema.   Skin:    General: Skin is warm and dry.  Neurological:     Mental Status: She is alert.     (all labs ordered are listed, but only abnormal results are displayed) Labs Reviewed - No data to display  EKG: None  Radiology: DG Wrist Complete Left Result Date: 03/03/2024 CLINICAL DATA:  Pain after fall on arm. EXAM: LEFT WRIST - COMPLETE 3+ VIEW COMPARISON:  None Available. FINDINGS: There is no evidence of fracture or dislocation. Multifocal osteoarthritis, most prominently involving the thumb carpal metacarpal joint. There are degenerative carpal bone cysts. Vascular calcifications. IMPRESSION: 1. No acute fracture or dislocation of the left wrist. 2. Multifocal osteoarthritis. Electronically Signed   By: Andrea Gasman M.D.   On: 03/03/2024 17:06   DG Elbow Complete Left Result Date: 03/03/2024 CLINICAL DATA:  Pain after fall onto arm. EXAM: LEFT ELBOW - COMPLETE 3+ VIEW COMPARISON:  None Available. FINDINGS: There is spurring at the radial head neck junction, unclear if this is related to fracture or osteophytes. No other fracture. Moderate ulnar trochlear osteoarthritis. Joint effusion with intra-articular bodies. Vascular calcifications are seen. IMPRESSION: Moderate osteoarthritis. There is spurring at the radial head neck junction, unclear if this is related to degenerative osteophytes or fracture. There is a joint effusion with intra-articular bodies consistent with chronic arthropathy. Electronically Signed   By: Andrea Gasman M.D.   On: 03/03/2024 17:05     Procedures   Medications Ordered in the ED - No data to display  73 y.o. female presents to the ED with complaints of left elbow and left wrist pain, this involves an extensive number of treatment options, and is a complaint that carries with it a high risk of complications and morbidity.  The differential diagnosis includes fracture, arthritis, tendinitis, dislocation (Ddx)  On arrival pt is nontoxic, vitals unremarkable.  Exam significant for pain to palpation of the left elbow and left wrist.   Imaging Studies ordered:  I ordered imaging studies which included x-ray of the left wrist and elbow, I independently visualized and interpreted imaging which showed potential osteophyte or fracture.  ED Course:   Patient sitting comfortably in ED bed in no obvious distress.  Patient has equal strength in bilateral extremities.  No loss of sensation, patient has good pulses and cap refill.  Patient is able to do daily activities of life but reports some movements will cause pain from her wrist to her elbow.  Patient has full range of motion in her shoulder no pain to palpation in the shoulder or clavicle.  Patient has full range of motion in elbow wrist and hand.  Some movements reports 3 out of 10 pain in the elbow.  It was advised to the patient findings on x-ray and advised her to follow-up with orthopedics.  With injury several weeks ago  and patient has been routinely using the arm without difficulty it is uncertain if this is fracture and more likely arthritic pain.  It was advised the patient follow-up to Ortho for definitive diagnosis and chronic management.  Patient agreed with treatment plan and was comfortable discharge  Portions of this note were generated with Dragon dictation software. Dictation errors may occur despite best attempts at proofreading.   Final diagnoses:  Left elbow pain    ED Discharge Orders     None          Myriam Fonda GORMAN DEVONNA 03/03/24 1845    Emil Share, DO 03/03/24 2052

## 2024-03-08 ENCOUNTER — Encounter (HOSPITAL_BASED_OUTPATIENT_CLINIC_OR_DEPARTMENT_OTHER): Payer: Self-pay | Admitting: Student

## 2024-03-08 ENCOUNTER — Ambulatory Visit (HOSPITAL_BASED_OUTPATIENT_CLINIC_OR_DEPARTMENT_OTHER): Admitting: Student

## 2024-03-08 DIAGNOSIS — M19022 Primary osteoarthritis, left elbow: Secondary | ICD-10-CM

## 2024-03-08 DIAGNOSIS — M25522 Pain in left elbow: Secondary | ICD-10-CM

## 2024-03-08 MED ORDER — TRIAMCINOLONE ACETONIDE 40 MG/ML IJ SUSP
2.0000 mL | INTRAMUSCULAR | Status: AC | PRN
  Administered 2024-03-08: 2 mL via INTRA_ARTICULAR

## 2024-03-08 MED ORDER — LIDOCAINE HCL 1 % IJ SOLN
2.0000 mL | INTRAMUSCULAR | Status: AC | PRN
  Administered 2024-03-08: 2 mL

## 2024-03-08 NOTE — Progress Notes (Signed)
 Chief Complaint: Left elbow pain     History of Present Illness:    Kayla Ray is a 73 y.o. female who presents today for evaluation of left elbow pain.  She has experienced persistent pain since a fall onto her left side about 7 weeks ago.  She was seen on 9/3 in the ED for x-rays.  She continues to experience pain particularly with movement and does get relief at rest.  She denies any numbness or tingling.  She has been using ibuprofen, Aleve, and ice.  No previous history of elbow pain or injury prior to the fall in July.   Surgical History:   None  PMH/PSH/Family History/Social History/Meds/Allergies:    Past Medical History:  Diagnosis Date   Arthritis    Asthma    Breast cancer (HCC)    Depression    patient says it is for depression   Diabetes mellitus without complication (HCC)    Hypothyroidism    Sleep apnea    Past Surgical History:  Procedure Laterality Date   BREAST BIOPSY Right 2019   BREAST BIOPSY Right 12/10/2022   US  RT BREAST BX W LOC DEV 1ST LESION IMG BX SPEC US  GUIDE 12/10/2022 [facility]   BREAST BIOPSY  01/17/2023   MM RT RADIOACTIVE SEED LOC MAMMO GUIDE 01/17/2023 [facility]   BREAST LUMPECTOMY WITH RADIOACTIVE SEED LOCALIZATION Right 01/20/2023   Procedure: RIGHT BREAST LUMPECTOMY WITH RADIOACTIVE SEED LOCALIZATION;  Surgeon: Curvin Deward MOULD, MD;  Location: [facility];  Service: General;  Laterality: Right;   CATARACT EXTRACTION     CESAREAN SECTION     x2   GALLBLADDER SURGERY     VAGINAL HYSTERECTOMY  1984   Social History   Socioeconomic History   Marital status: Married    Spouse name: Not on file   Number of children: Not on file   Years of education: Not on file   Highest education level: Not on file  Occupational History   Not on file  Tobacco Use   Smoking status: Never   Smokeless tobacco: Never  Vaping Use   Vaping status: Never Used  Substance and Sexual  Activity   Alcohol use: Yes    Comment: OCC   Drug use: Never   Sexual activity: Not Currently    Partners: Male    Comment: 1st intercourse- 32, married- 45 yrs   Other Topics Concern   Not on file  Social History Narrative   Not on file   Social Drivers of Health   Financial Resource Strain: Not on file  Food Insecurity: No Food Insecurity (12/27/2022)   Hunger Vital Sign    Worried About Running Out of Food in the Last Year: Never true    Ran Out of Food in the Last Year: Never true  Transportation Needs: No Transportation Needs (12/27/2022)   PRAPARE - Transportation    Lack of Transportation (Medical): No    Lack of Transportation (Non-Medical): No  Physical Activity: Not on file  Stress: Not on file  Social Connections: Not on file   Family History  Problem Relation Age of Onset   Diabetes Mother    Heart disease Mother    Lung cancer Father        d. 36   Lymphoma Sister  dx 56s   Heart disease Sister    Other Sister        Lynch syndrome   Diabetes Maternal Grandmother    Stomach cancer Cousin        dx 15s; several other cancers; Lynch syndrome   Allergies  Allergen Reactions   Bactrim [Sulfamethoxazole-Trimethoprim] Rash    Chest tightness   Glimepiride Rash   Jardiance [Empagliflozin] Other (See Comments)   Metformin And Related Diarrhea   Current Outpatient Medications  Medication Sig Dispense Refill   albuterol  (PROVENTIL ) (2.5 MG/3ML) 0.083% nebulizer solution Take 2.5 mg by nebulization every 6 (six) hours as needed for wheezing or shortness of breath.     albuterol  (VENTOLIN  HFA) 108 (90 Base) MCG/ACT inhaler Inhale 1-2 puffs into the lungs every 6 (six) hours as needed for wheezing or shortness of breath. 8 g 2   anastrozole  (ARIMIDEX ) 1 MG tablet Take 1 tablet (1 mg total) by mouth daily. 90 tablet 3   atorvastatin (LIPITOR) 10 MG tablet Take 10 mg by mouth daily.  3   Dorzolamide HCl-Timolol Mal PF 2-0.5 % SOLN Place 1 drop into both eyes  2 (two) times daily.     estazolam (PROSOM) 2 MG tablet Take 2 mg by mouth at bedtime as needed (sleep).     fluticasone  (FLONASE ) 50 MCG/ACT nasal spray Place 1 spray into both nostrils daily. 16 g 2   fluticasone  furoate-vilanterol (BREO ELLIPTA ) 100-25 MCG/ACT AEPB Inhale 1 puff into the lungs daily. 60 each 3   lamoTRIgine (LAMICTAL) 25 MG tablet Take 50 mg by mouth at bedtime.     levothyroxine (SYNTHROID, LEVOTHROID) 50 MCG tablet Take 50 mcg by mouth daily before breakfast.     montelukast  (SINGULAIR ) 10 MG tablet Take 1 tablet (10 mg total) by mouth at bedtime. 90 tablet 3   multivitamin-lutein (OCUVITE-LUTEIN) CAPS capsule Take 1 capsule by mouth daily.     NON FORMULARY Pt uses a cpap nightly     Travoprost, BAK Free, (TRAVATAN) 0.004 % SOLN ophthalmic solution Place 1 drop into both eyes at bedtime.     traZODone (DESYREL) 100 MG tablet Take 200 mg by mouth at bedtime.     venlafaxine XR (EFFEXOR-XR) 150 MG 24 hr capsule Take 300 mg by mouth daily with breakfast.     No current facility-administered medications for this visit.   No results found.  Review of Systems:   A ROS was performed including pertinent positives and negatives as documented in the HPI.  Physical Exam :   Constitutional: NAD and appears stated age Neurological: Alert and oriented Psych: Appropriate affect and cooperative There were no vitals taken for this visit.   Comprehensive Musculoskeletal Exam:    Exam of the left elbow demonstrates mild tenderness over the radial head.  Active range of motion is from 20 to 120 degrees.  Slightly limited in active supination.  Distal motor and neurosensory exam is intact.  Radial pulse 2+.  Imaging:   Xray review from 03/03/2024 (left elbow 4 views): Moderate osteoarthritis with multiple osteophytes and loose bodies.  No evidence of fracture.   I personally reviewed and interpreted the radiographs.   Assessment:   73 y.o. female with persistent left elbow  pain.  This was brought on by a fall back in July.  She had this evaluated last week in the ED and x-rays show notable osteoarthritis within the elbow with multiple loose bodies.  No obvious fractures noted.  I believe the arthritis is  continuing to cause her discomfort.  I have offered a cortisone injection which patient is agreeable to proceeding with today.  Injection was placed in the elbow without complication.  Will plan to assess relief with this and would like her to follow-up if symptoms continue to persist, in which case I may have her consult with Dr. Genelle to see if she would benefit from an arthroscopy.  Plan :    - Left elbow injection performed today - Return to clinic as needed     Procedure Note  Patient: Charlett Jungling             Date of Birth: 1950/10/17           MRN: [id number]             Visit Date: 03/08/2024  Procedures: Visit Diagnoses:  1. Primary osteoarthritis of left elbow     Medium Joint Inj: L elbow on 03/08/2024 11:04 AM Indications: pain Details: 25 G 1.5 in needle, lateral approach Medications: 2 mL lidocaine  1 %; 2 mL triamcinolone  acetonide 40 MG/ML Outcome: tolerated well, no immediate complications Procedure, treatment alternatives, risks and benefits explained, specific risks discussed. Consent was given by the patient. Immediately prior to procedure a time out was called to verify the correct patient, procedure, equipment, support staff and site/side marked as required. Patient was prepped and draped in the usual sterile fashion.       I personally saw and evaluated the patient, and participated in the management and treatment plan.  Leonce Reveal, PA-C Orthopedics

## 2024-03-09 ENCOUNTER — Ambulatory Visit (HOSPITAL_BASED_OUTPATIENT_CLINIC_OR_DEPARTMENT_OTHER): Admitting: Student

## 2024-03-26 DIAGNOSIS — G4733 Obstructive sleep apnea (adult) (pediatric): Secondary | ICD-10-CM | POA: Diagnosis not present

## 2024-04-15 ENCOUNTER — Telehealth: Payer: Self-pay | Admitting: Hematology and Oncology

## 2024-04-15 ENCOUNTER — Other Ambulatory Visit: Payer: Self-pay | Admitting: Hematology and Oncology

## 2024-04-15 NOTE — Telephone Encounter (Signed)
 left vm for pt about schedule appt date and time. Encouraged to call back if need to reschedule

## 2024-04-21 DIAGNOSIS — E1139 Type 2 diabetes mellitus with other diabetic ophthalmic complication: Secondary | ICD-10-CM | POA: Diagnosis not present

## 2024-04-21 DIAGNOSIS — M545 Low back pain, unspecified: Secondary | ICD-10-CM | POA: Diagnosis not present

## 2024-04-21 DIAGNOSIS — M25551 Pain in right hip: Secondary | ICD-10-CM | POA: Diagnosis not present

## 2024-04-21 DIAGNOSIS — M25552 Pain in left hip: Secondary | ICD-10-CM | POA: Diagnosis not present

## 2024-04-22 ENCOUNTER — Ambulatory Visit (HOSPITAL_BASED_OUTPATIENT_CLINIC_OR_DEPARTMENT_OTHER): Admitting: Obstetrics & Gynecology

## 2024-05-03 ENCOUNTER — Ambulatory Visit (INDEPENDENT_AMBULATORY_CARE_PROVIDER_SITE_OTHER): Admitting: Certified Nurse Midwife

## 2024-05-03 ENCOUNTER — Encounter (HOSPITAL_BASED_OUTPATIENT_CLINIC_OR_DEPARTMENT_OTHER): Payer: Self-pay | Admitting: Certified Nurse Midwife

## 2024-05-03 ENCOUNTER — Encounter: Payer: Self-pay | Admitting: Radiology

## 2024-05-03 VITALS — BP 154/73 | HR 97 | Ht 59.0 in | Wt 242.2 lb

## 2024-05-03 DIAGNOSIS — H401131 Primary open-angle glaucoma, bilateral, mild stage: Secondary | ICD-10-CM | POA: Diagnosis not present

## 2024-05-03 DIAGNOSIS — Z780 Asymptomatic menopausal state: Secondary | ICD-10-CM | POA: Diagnosis not present

## 2024-05-03 DIAGNOSIS — Z961 Presence of intraocular lens: Secondary | ICD-10-CM | POA: Diagnosis not present

## 2024-05-03 DIAGNOSIS — Z01419 Encounter for gynecological examination (general) (routine) without abnormal findings: Secondary | ICD-10-CM

## 2024-05-03 DIAGNOSIS — H10413 Chronic giant papillary conjunctivitis, bilateral: Secondary | ICD-10-CM | POA: Diagnosis not present

## 2024-05-03 DIAGNOSIS — Z853 Personal history of malignant neoplasm of breast: Secondary | ICD-10-CM | POA: Diagnosis not present

## 2024-05-03 DIAGNOSIS — Z1211 Encounter for screening for malignant neoplasm of colon: Secondary | ICD-10-CM

## 2024-05-03 DIAGNOSIS — E119 Type 2 diabetes mellitus without complications: Secondary | ICD-10-CM | POA: Diagnosis not present

## 2024-05-03 DIAGNOSIS — Z1331 Encounter for screening for depression: Secondary | ICD-10-CM | POA: Diagnosis not present

## 2024-05-03 NOTE — Progress Notes (Addendum)
 73 y.o. G2P2 Married White or Caucasian female here for annual exam.  Pt was diagnosed with Right Breast Cancer 05/2023 and discontinued hormone therapy.   No LMP recorded. Patient has had a hysterectomy.          Sexually active: No.  Exercising: Pt has started exercising and healthy diet and has lost 11lb   Smoker:  no  Health Maintenance: Pap:  Pap not indicated, Hysterectomy due to benign disease History of abnormal Pap:  no MMG:  UTD per pt Colonoscopy:  Pt thinks she may be due for Colonoscopy BMD:   Ordered Screening Labs: PCP   reports that she has never smoked. She has never used smokeless tobacco. She reports current alcohol use. She reports that she does not use drugs.  Past Medical History:  Diagnosis Date   Arthritis    Asthma    Breast cancer (HCC)    Depression    patient says it is for depression   Diabetes mellitus without complication (HCC)    Hypothyroidism    Sleep apnea     Past Surgical History:  Procedure Laterality Date   BREAST BIOPSY Right 2019   BREAST BIOPSY Right 12/10/2022   US  RT BREAST BX W LOC DEV 1ST LESION IMG BX SPEC US  GUIDE 12/10/2022 [facility]   BREAST BIOPSY  01/17/2023   MM RT RADIOACTIVE SEED LOC MAMMO GUIDE 01/17/2023 [facility]   BREAST LUMPECTOMY WITH RADIOACTIVE SEED LOCALIZATION Right 01/20/2023   Procedure: RIGHT BREAST LUMPECTOMY WITH RADIOACTIVE SEED LOCALIZATION;  Surgeon: Curvin Mt III, MD;  Location: [facility];  Service: General;  Laterality: Right;   CATARACT EXTRACTION     CESAREAN SECTION     x2   GALLBLADDER SURGERY     VAGINAL HYSTERECTOMY  1984    Current Outpatient Medications  Medication Sig Dispense Refill   albuterol  (PROVENTIL ) (2.5 MG/3ML) 0.083% nebulizer solution Take 2.5 mg by nebulization every 6 (six) hours as needed for wheezing or shortness of breath.     albuterol  (VENTOLIN  HFA) 108 (90 Base) MCG/ACT inhaler Inhale 1-2 puffs into the lungs every 6 (six) hours as needed  for wheezing or shortness of breath. 8 g 2   anastrozole  (ARIMIDEX ) 1 MG tablet TAKE 1 TABLET BY MOUTH EVERY DAY 90 tablet 0   atorvastatin (LIPITOR) 10 MG tablet Take 10 mg by mouth daily.  3   Dorzolamide HCl-Timolol Mal PF 2-0.5 % SOLN Place 1 drop into both eyes 2 (two) times daily.     estazolam (PROSOM) 2 MG tablet Take 2 mg by mouth at bedtime as needed (sleep).     fluticasone  (FLONASE ) 50 MCG/ACT nasal spray Place 1 spray into both nostrils daily. 16 g 2   fluticasone  furoate-vilanterol (BREO ELLIPTA ) 100-25 MCG/ACT AEPB Inhale 1 puff into the lungs daily. 60 each 3   lamoTRIgine (LAMICTAL) 25 MG tablet Take 50 mg by mouth at bedtime.     levothyroxine (SYNTHROID, LEVOTHROID) 50 MCG tablet Take 50 mcg by mouth daily before breakfast.     montelukast  (SINGULAIR ) 10 MG tablet Take 1 tablet (10 mg total) by mouth at bedtime. 90 tablet 3   multivitamin-lutein (OCUVITE-LUTEIN) CAPS capsule Take 1 capsule by mouth daily.     NON FORMULARY Pt uses a cpap nightly     Travoprost, BAK Free, (TRAVATAN) 0.004 % SOLN ophthalmic solution Place 1 drop into both eyes at bedtime.     traZODone (DESYREL) 100 MG tablet Take 200 mg by mouth at  bedtime.     venlafaxine XR (EFFEXOR-XR) 150 MG 24 hr capsule Take 300 mg by mouth daily with breakfast.     No current facility-administered medications for this visit.    Family History  Problem Relation Age of Onset   Diabetes Mother    Heart disease Mother    Lung cancer Father        d. 41   Lymphoma Sister        dx 35s   Heart disease Sister    Other Sister        Lynch syndrome   Diabetes Maternal Grandmother    Stomach cancer Cousin        dx 43s; several other cancers; Lynch syndrome    ROS: Constitutional: negative Genitourinary:negative  Exam:   BP (!) 154/73   Pulse 97   Ht 4' 11 (1.499 m) Comment: Reported  Wt 242 lb 3.2 oz (109.9 kg)   BMI 48.92 kg/m   Height: 4' 11 (149.9 cm) (Reported)  General appearance: alert,  cooperative and appears stated age Head: Normocephalic, without obvious abnormality, atraumatic Lungs: clear to auscultation bilaterally Breasts: normal appearance, no masses or tenderness, Inspection negative, No nipple retraction or dimpling, No nipple discharge or bleeding, No axillary or supraclavicular adenopathy, Normal to palpation without dominant masses, Hx Right Lumpectomy (Breast Ca) Heart: regular rate and rhythm Abdomen: soft, non-tender; bowel sounds normal; no masses,  no organomegaly Extremities: extremities normal, atraumatic, no cyanosis or edema Skin: Skin color, texture, turgor normal. No rashes or lesions Lymph nodes: Cervical, supraclavicular, and axillary nodes normal. No abnormal inguinal nodes palpated Neurologic: Grossly normal   Pelvic: External genitalia:  no lesions              Urethra:  normal appearing urethra with no masses, tenderness or lesions              Bartholins and Skenes: normal                 Vagina: normal appearing vagina with normal color and no discharge, no lesions              Cervix: absent              Pap taken: No. Bimanual Exam:  Uterus:  uterus absent              Adnexa: normal adnexa               Rectovaginal: Confirms               Anus:  normal sphincter tone, no lesions  Chaperone,  CMA, was present for exam.  Assessment/Plan:  1. Encounter for annual routine gynecological examination (Primary) - Pap smear not indicated - Pt praised for 11lb weight loss with healthy diet and exercise - Referral placed to GI for Colonoscopy - Bone Mineral Density DEXA Scan ordered - Continue breast self awareness  2. HX: breast cancer - Pt discontinued hormone therapy and will follow-up as directed (05/18/24) MedOnc  Arland POUR Samuele Storey

## 2024-05-18 ENCOUNTER — Inpatient Hospital Stay: Attending: Hematology and Oncology | Admitting: Hematology and Oncology

## 2024-05-18 VITALS — BP 132/73 | HR 93 | Temp 97.9°F | Resp 16 | Ht 59.0 in | Wt 242.9 lb

## 2024-05-18 DIAGNOSIS — C50411 Malignant neoplasm of upper-outer quadrant of right female breast: Secondary | ICD-10-CM | POA: Insufficient documentation

## 2024-05-18 DIAGNOSIS — Z79811 Long term (current) use of aromatase inhibitors: Secondary | ICD-10-CM | POA: Insufficient documentation

## 2024-05-18 DIAGNOSIS — Z931 Gastrostomy status: Secondary | ICD-10-CM | POA: Diagnosis not present

## 2024-05-18 DIAGNOSIS — Z79899 Other long term (current) drug therapy: Secondary | ICD-10-CM | POA: Diagnosis not present

## 2024-05-18 DIAGNOSIS — Z170 Estrogen receptor positive status [ER+]: Secondary | ICD-10-CM | POA: Insufficient documentation

## 2024-05-18 DIAGNOSIS — Z882 Allergy status to sulfonamides status: Secondary | ICD-10-CM | POA: Diagnosis not present

## 2024-05-18 DIAGNOSIS — Z1721 Progesterone receptor positive status: Secondary | ICD-10-CM | POA: Diagnosis not present

## 2024-05-18 DIAGNOSIS — Z1732 Human epidermal growth factor receptor 2 negative status: Secondary | ICD-10-CM | POA: Diagnosis not present

## 2024-05-18 MED ORDER — ANASTROZOLE 1 MG PO TABS: 1.0000 mg | ORAL_TABLET | Freq: Every day | ORAL | 3 refills | Status: AC

## 2024-05-18 NOTE — Progress Notes (Signed)
 Patient Care Team: Cleotilde Planas, MD as PCP - General (Family Medicine) Odean Potts, MD as Consulting Physician (Hematology and Oncology) Curvin Deward MOULD, MD as Consulting Physician (General Surgery) Izell Domino, MD as Attending Physician (Radiation Oncology) Tyree Nanetta SAILOR, RN as Oncology Nurse Navigator  DIAGNOSIS:  Encounter Diagnosis  Name Primary?   Malignant neoplasm of upper-outer quadrant of right breast in female, estrogen receptor positive (HCC) Yes    SUMMARY OF ONCOLOGIC HISTORY: Oncology History  Malignant neoplasm of upper-outer quadrant of right breast in female, estrogen receptor positive (HCC)  12/10/2022 Initial Diagnosis   Screening mammogram detected right breast mass UOQ 10 o'clock position: 0.7 cm, axilla negative, ultrasound biopsy: Grade 1 IDC with DCIS, ER 95%, PR 95%, Ki-67 5%, HER2 2+ by IHC, FISH negative   12/18/2022 Cancer Staging   Staging form: Breast, AJCC 8th Edition - Clinical: Stage IA (cT1b, cN0, cM0, G1, ER+, PR+, HER2-) - Signed by Odean Potts, MD on 12/18/2022 Histologic grading system: 3 grade system   01/20/2023 Surgery   BREAST, RIGHT, LUMPECTOMY:  Invasive ductal carcinoma, 0.7 cm, grade 1 (pT1b)  Ductal carcinoma in situ: Present, cribriform, intermediate nuclear  grade  Margins, invasive: Negative      Closest, invasive: Inferior, 5 mm  Margins, DCIS: Negative      Closest, DCIS: Inferior, 7 mm  Lymphovascular invasion: Not identified  Prognostic markers:  ER positive, PR positive, Her2 negative, Ki-67 5%  Other: Vascular calcifications  See oncology table   B. BREAST, RIGHT POSTERIOR MARGIN, EXCISION:  Benign breast tissue  Posterior margin negative for carcinoma   C. BREAST, RIGHT LATERAL MARGIN, EXCISION:  Benign breast tissue  Lateral margin negative for carcinoma    01/2023 -  Anti-estrogen oral therapy   1 mg Anastrozole  daily x 5 years     CHIEF COMPLIANT: Surveillance of breast cancer on anastrozole   therapy  HISTORY OF PRESENT ILLNESS:   History of Present Illness Kayla Ray is a 73 year old female with breast cancer who presents for follow-up on anastrozole  therapy.  She has been on anastrozole  for approximately one year and four months without significant side effects such as hot flashes or joint stiffness. Her current medications include anastrozole . In November 2023, metastatic disease was detected, leading to a switch to Verzenio AI therapy. 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 She has been hospitalized multiple times for intractable nausea and vomiting, and she has a venting gastrostomy tube in place.     ALLERGIES:  is allergic to bactrim [sulfamethoxazole-trimethoprim], glimepiride, jardiance [empagliflozin], and metformin and related.  MEDICATIONS:  Current Outpatient Medications  Medication Sig Dispense Refill   albuterol  (PROVENTIL ) (2.5 MG/3ML) 0.083% nebulizer solution Take 2.5 mg by nebulization every 6 (six) hours as needed for wheezing or shortness of breath.     albuterol  (VENTOLIN  HFA) 108 (90 Base) MCG/ACT inhaler Inhale 1-2 puffs into the lungs every 6 (six) hours as needed for wheezing or shortness of breath. 8 g 2   anastrozole  (ARIMIDEX ) 1 MG tablet TAKE 1 TABLET BY MOUTH EVERY DAY 90 tablet 0   atorvastatin (LIPITOR) 10 MG tablet Take 10 mg by mouth daily.  3   Dorzolamide HCl-Timolol Mal PF 2-0.5 % SOLN Place 1 drop into both eyes 2 (two) times daily.     estazolam (PROSOM) 2 MG tablet Take 2 mg by mouth at bedtime as needed (sleep).  fluticasone  (FLONASE ) 50 MCG/ACT nasal spray Place 1 spray into both nostrils daily. 16 g 2   fluticasone  furoate-vilanterol (BREO ELLIPTA ) 100-25 MCG/ACT AEPB Inhale 1 puff into the lungs daily. 60 each 3   lamoTRIgine (LAMICTAL) 25 MG tablet Take 50 mg by mouth at bedtime.     levothyroxine (SYNTHROID,  LEVOTHROID) 50 MCG tablet Take 50 mcg by mouth daily before breakfast.     montelukast  (SINGULAIR ) 10 MG tablet Take 1 tablet (10 mg total) by mouth at bedtime. 90 tablet 3   multivitamin-lutein (OCUVITE-LUTEIN) CAPS capsule Take 1 capsule by mouth daily.     NON FORMULARY Pt uses a cpap nightly     Travoprost, BAK Free, (TRAVATAN) 0.004 % SOLN ophthalmic solution Place 1 drop into both eyes at bedtime.     traZODone (DESYREL) 100 MG tablet Take 200 mg by mouth at bedtime.     venlafaxine XR (EFFEXOR-XR) 150 MG 24 hr capsule Take 300 mg by mouth daily with breakfast.     No current facility-administered medications for this visit.    PHYSICAL EXAMINATION: ECOG PERFORMANCE STATUS: 1 - Symptomatic but completely ambulatory  Vitals:   05/18/24 1134  BP: 132/73  Pulse: 93  Resp: 16  Temp: 97.9 F (36.6 C)  SpO2: 97%   Filed Weights   05/18/24 1134  Weight: 242 lb 14.4 oz (110.2 kg)    Physical Exam BREAST: Breasts normal  (exam performed in the presence of a chaperone)  LABORATORY DATA:  I have reviewed the data as listed    Latest Ref Rng & Units 01/20/2023    8:56 AM 12/18/2022   12:39 PM 02/13/2021    3:45 PM  CMP  Glucose 70 - 99 mg/dL 852  826  892   BUN 8 - 23 mg/dL 10  14  7    Creatinine 0.44 - 1.00 mg/dL 9.23  9.27  9.37   Sodium 135 - 145 mmol/L 134  136  132   Potassium 3.5 - 5.1 mmol/L 3.8  4.2  3.8   Chloride 98 - 111 mmol/L 102  103  95   CO2 22 - 32 mmol/L 24  28  25    Calcium 8.9 - 10.3 mg/dL 8.5  9.4  9.5   Total Protein 6.5 - 8.1 g/dL  6.3    Total Bilirubin 0.3 - 1.2 mg/dL  0.3    Alkaline Phos 38 - 126 U/L  77    AST 15 - 41 U/L  20    ALT 0 - 44 U/L  18      Lab Results  Component Value Date   WBC 9.7 01/10/2023   HGB 11.8 (L) 01/10/2023   HCT 37.5 01/10/2023   MCV 88.7 01/10/2023   PLT 260 01/10/2023   NEUTROABS 5.8 12/18/2022    ASSESSMENT & PLAN:  Malignant neoplasm of upper-outer quadrant of right breast in female, estrogen receptor  positive (HCC) 12/10/2022:Screening mammogram detected right breast mass UOQ 10 o'clock position: 0.7 cm, axilla negative, ultrasound biopsy: Grade 1 IDC with DCIS, ER 95%, PR 95%, Ki-67 5%, HER2 2+ by IHC, FISH negative    01/20/2023: Right lumpectomy: Grade 1 IDC 0.7 cm with intermediate grade DCIS, margins negative, LVI not identified, ER 95%, PR 95%, HER2 equivocal by IHC negative by FISH ratio 1.11, Ki-67 5%   Treatment plan: Adjuvant radiation (patient decided not to do radiation) Antiestrogen therapy with anastrozole  1 mg daily x 5 years started August 2024   Anastrozole  toxicities: Tolerating it  extremely well without any problems or concerns.  Denies any hot flashes or joint stiffness.  Breast cancer surveillance: Breast exam 05/18/2024: Benign Mammogram: Has not been done.  I sent a request for another mammogram to be done.  I provided her with the phone number to call and make that appointment.  Return to clinic in 1 year for follow-up     No orders of the defined types were placed in this encounter.  The patient has a good understanding of the overall plan. she agrees with it. she will call with any problems that may develop before the next visit here.  I personally spent a total of 30 minutes in the care of the patient today including preparing to see the patient, getting/reviewing separately obtained history, performing a medically appropriate exam/evaluation, counseling and educating, placing orders, referring and communicating with other health care professionals, documenting clinical information in the EHR, independently interpreting results, communicating results, and coordinating care.   Viinay K Abia Monaco, MD 05/18/24

## 2024-05-18 NOTE — Assessment & Plan Note (Signed)
 12/10/2022:Screening mammogram detected right breast mass UOQ 10 o'clock position: 0.7 cm, axilla negative, ultrasound biopsy: Grade 1 IDC with DCIS, ER 95%, PR 95%, Ki-67 5%, HER2 2+ by IHC, FISH negative    01/20/2023: Right lumpectomy: Grade 1 IDC 0.7 cm with intermediate grade DCIS, margins negative, LVI not identified, ER 95%, PR 95%, HER2 equivocal by IHC negative by FISH ratio 1.11, Ki-67 5%   Treatment plan: Adjuvant radiation (patient decided not to do radiation) Antiestrogen therapy with anastrozole  1 mg daily x 5 years started August 2024   Anastrozole  toxicities:  Breast cancer surveillance: Breast exam 05/18/2024: Benign Mammogram  Return to clinic in 1 year for follow-up

## 2024-05-19 DIAGNOSIS — F3341 Major depressive disorder, recurrent, in partial remission: Secondary | ICD-10-CM | POA: Diagnosis not present

## 2024-05-19 DIAGNOSIS — Z5181 Encounter for therapeutic drug level monitoring: Secondary | ICD-10-CM | POA: Diagnosis not present

## 2024-05-25 DIAGNOSIS — S20211D Contusion of right front wall of thorax, subsequent encounter: Secondary | ICD-10-CM | POA: Diagnosis not present

## 2024-05-25 DIAGNOSIS — E1139 Type 2 diabetes mellitus with other diabetic ophthalmic complication: Secondary | ICD-10-CM | POA: Diagnosis not present

## 2024-05-25 DIAGNOSIS — E782 Mixed hyperlipidemia: Secondary | ICD-10-CM | POA: Diagnosis not present

## 2024-05-25 DIAGNOSIS — E039 Hypothyroidism, unspecified: Secondary | ICD-10-CM | POA: Diagnosis not present

## 2024-06-02 ENCOUNTER — Ambulatory Visit
Admission: RE | Admit: 2024-06-02 | Discharge: 2024-06-02 | Disposition: A | Source: Ambulatory Visit | Attending: Hematology and Oncology

## 2024-06-02 DIAGNOSIS — R928 Other abnormal and inconclusive findings on diagnostic imaging of breast: Secondary | ICD-10-CM | POA: Diagnosis not present

## 2024-06-02 DIAGNOSIS — C50411 Malignant neoplasm of upper-outer quadrant of right female breast: Secondary | ICD-10-CM

## 2024-06-06 ENCOUNTER — Encounter (HOSPITAL_BASED_OUTPATIENT_CLINIC_OR_DEPARTMENT_OTHER): Payer: Self-pay

## 2024-06-06 ENCOUNTER — Emergency Department (HOSPITAL_BASED_OUTPATIENT_CLINIC_OR_DEPARTMENT_OTHER)

## 2024-06-06 ENCOUNTER — Emergency Department (HOSPITAL_BASED_OUTPATIENT_CLINIC_OR_DEPARTMENT_OTHER)
Admission: EM | Admit: 2024-06-06 | Discharge: 2024-06-06 | Disposition: A | Discharge status: Home / Self Care | Attending: Emergency Medicine | Admitting: Emergency Medicine

## 2024-06-06 ENCOUNTER — Other Ambulatory Visit: Payer: Self-pay

## 2024-06-06 DIAGNOSIS — M778 Other enthesopathies, not elsewhere classified: Secondary | ICD-10-CM | POA: Diagnosis not present

## 2024-06-06 DIAGNOSIS — M1812 Unilateral primary osteoarthritis of first carpometacarpal joint, left hand: Secondary | ICD-10-CM | POA: Diagnosis not present

## 2024-06-06 DIAGNOSIS — M779 Enthesopathy, unspecified: Secondary | ICD-10-CM | POA: Diagnosis not present

## 2024-06-06 DIAGNOSIS — M67833 Other specified disorders of tendon, right wrist: Secondary | ICD-10-CM | POA: Diagnosis not present

## 2024-06-06 DIAGNOSIS — M25532 Pain in left wrist: Secondary | ICD-10-CM | POA: Diagnosis not present

## 2024-06-06 MED ORDER — TRAMADOL HCL 50 MG PO TABS: 50.0000 mg | ORAL_TABLET | Freq: Four times a day (QID) | ORAL | 0 refills | Status: AC | PRN

## 2024-06-06 MED ORDER — IBUPROFEN 400 MG PO TABS
400.0000 mg | ORAL_TABLET | Freq: Once | ORAL | Status: AC
  Administered 2024-06-06: 400 mg via ORAL
  Filled 2024-06-06: qty 1

## 2024-06-06 NOTE — ED Provider Notes (Signed)
 [place] EMERGENCY DEPARTMENT AT [facility] Provider Note   CSN: [id number] Arrival date & time: 06/06/24  1228     Patient presents with: Wrist Pain   Kayla Ray is a 73 y.o. female.   Patient c/o left wrist area pain in past couple months. States originally had a fall onto wrist and  then had near fall where she grabbed onto something with left hand to prevent fall - no immediate pain, but since then persistent constant pain to left wrist especially from base of thumb running proximally. Is worse w certain movements of wrist/thumb/hand. Denies swelling/redness to area. No fevers. No hand/finger numbness. Is right hand dominant.   The history is provided by the patient and medical records.  Wrist Pain       Prior to Admission medications   Medication Sig Start Date End Date Taking? Authorizing Provider  traMADol  (ULTRAM ) 50 MG tablet Take 1 tablet (50 mg total) by mouth every 6 (six) hours as needed. 06/06/24  Yes Bernard Drivers, MD  albuterol  (PROVENTIL ) (2.5 MG/3ML) 0.083% nebulizer solution Take 2.5 mg by nebulization every 6 (six) hours as needed for wheezing or shortness of breath.    [provider]  albuterol  (VENTOLIN  HFA) 108 (90 Base) MCG/ACT inhaler Inhale 1-2 puffs into the lungs every 6 (six) hours as needed for wheezing or shortness of breath. 10/07/23   Hope Almarie ORN, NP  anastrozole  (ARIMIDEX ) 1 MG tablet Take 1 tablet (1 mg total) by mouth daily. 05/18/24   Gudena, Vinay, MD  atorvastatin (LIPITOR) 10 MG tablet Take 10 mg by mouth daily. 11/03/17   [provider]  Dorzolamide HCl-Timolol Mal PF 2-0.5 % SOLN Place 1 drop into both eyes 2 (two) times daily. 12/09/22   [provider]  estazolam (PROSOM) 2 MG tablet Take 2 mg by mouth at bedtime as needed (sleep). 09/24/22   [provider]  fluticasone  (FLONASE ) 50 MCG/ACT nasal spray Place 1 spray into both nostrils daily. 10/07/23   Hope Almarie ORN, NP  fluticasone   furoate-vilanterol (BREO ELLIPTA ) 100-25 MCG/ACT AEPB Inhale 1 puff into the lungs daily. 10/07/23   Hope Almarie ORN, NP  lamoTRIgine (LAMICTAL) 25 MG tablet Take 50 mg by mouth at bedtime.    [provider]  levothyroxine (SYNTHROID, LEVOTHROID) 50 MCG tablet Take 50 mcg by mouth daily before breakfast.    [provider]  montelukast  (SINGULAIR ) 10 MG tablet Take 1 tablet (10 mg total) by mouth at bedtime. 10/07/23   Hope Almarie ORN, NP  multivitamin-lutein [facility]) CAPS capsule Take 1 capsule by mouth daily.    [provider]  NON FORMULARY Pt uses a cpap nightly    [provider]  Travoprost, BAK Free, (TRAVATAN) 0.004 % SOLN ophthalmic solution Place 1 drop into both eyes at bedtime. 12/02/22   [provider]  traZODone (DESYREL) 100 MG tablet Take 200 mg by mouth at bedtime.    [provider]  venlafaxine XR (EFFEXOR-XR) 150 MG 24 hr capsule Take 300 mg by mouth daily with breakfast.    Husain, Karrar, MD    Allergies: Bactrim [sulfamethoxazole-trimethoprim], Glimepiride, Jardiance [empagliflozin], and Metformin and related    Review of Systems  Constitutional:  Negative for fever.  Musculoskeletal:        Left wrist pain  Skin:  Negative for wound.  Neurological:  Negative for weakness and numbness.    Updated Vital Signs BP 107/81 (BP Location: Right Arm)   Pulse 86   Temp  98 F (36.7 C) (Oral)   Resp 16   SpO2 99%   Physical Exam Vitals and nursing note reviewed.  Constitutional:      Appearance: Normal appearance. She is well-developed.  HENT:     Head: Atraumatic.  Eyes:     General: No scleral icterus.    Conjunctiva/sclera: Conjunctivae normal.  Neck:     Trachea: No tracheal deviation.  Cardiovascular:     Rate and Rhythm: Normal rate.     Pulses: Normal pulses.  Pulmonary:     Effort: Pulmonary effort is normal. No respiratory distress.  Musculoskeletal:     Cervical back: Neck supple. No  muscular tenderness.     Comments: Tenderness from base of left thumb extending proximal to left wrist. With ulnar deviation at wrist tucking thumb, etc, with reproduction of pain. No erythema or increased warmth to area. Rad pulse 2+. Normal cap refill in digits. Intact rom digits.   Skin:    General: Skin is warm and dry.     Findings: No rash.  Neurological:     Mental Status: She is alert.     Comments: Alert, speech normal. Left hand nvi w intact motor/sens fxn.   Psychiatric:        Mood and Affect: Mood normal.     (all labs ordered are listed, but only abnormal results are displayed) Labs Reviewed - No data to display  EKG: None  Radiology: DG Wrist Complete Left Result Date: 06/06/2024 CLINICAL DATA:  Clem[place], injury 2 months ago, pain EXAM: LEFT WRIST - COMPLETE 3+ VIEW COMPARISON:  03/03/2024 FINDINGS: Frontal, oblique, and lateral views of the left wrist are obtained. No acute displaced fracture, subluxation, or dislocation. 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 Soft tissues are unremarkable. IMPRESSION: 1. Stable osteoarthritis.  No displaced fracture. Electronically Signed   By: Ozell Daring M.D.   On: 06/06/2024 14:09     Procedures   Medications Ordered in the ED  ibuprofen  (ADVIL ) tablet 400 mg (400 mg Oral Given 06/06/24 1509)                                    Medical Decision Making Problems Addressed: Tendonitis of wrist, left: acute illness or injury  Amount and/or Complexity of Data Reviewed Radiology: ordered and independent interpretation performed. Decision-making details documented in ED Course.  Risk Prescription drug management.   Reviewed nursing notes and prior charts for additional history.   Imaging reviewed/interpreted by me - arthritic changes. No fx.   Suspect de Quervain type tendonitis.   Wrist brace. Ibuprofen .   Rec hand f/u.      Final diagnoses:  Tendonitis  of wrist, left    ED Discharge Orders          Ordered    traMADol  (ULTRAM ) 50 MG tablet  Every 6 hours PRN        06/06/24 1507               Bernard Drivers, MD 06/06/24 1512

## 2024-06-06 NOTE — Discharge Instructions (Addendum)
 It was our pleasure to provide your ER care today - we hope that you feel better.  Wear wrist brace for comfort/support for the next week. Take ibuprofen  or aleve as need for pain.  You may also take ultram  as need for pain - no driving when taking.   Follow up closely with hand/wrist specialist in the next 1-2 weeks - call office tomorrow AM to arrange appointment.   Return to ER if worse, new symptoms, fevers, spreading redness/increased swelling, or other concern.

## 2024-06-06 NOTE — ED Triage Notes (Signed)
 She states she fell ~ 1 month ago and injured her left wrist. She c/o persistent pain in left wrist which has gotten a lot worse recently.

## 2024-06-06 NOTE — ED Triage Notes (Addendum)
 Left wrist pain sec to x2 falls 2 months ago. Pain has worsened. Take tylenol  without relief.   On 04/26/24 Dr Lenora - rx for celebrex - medication initially decreased the pain but not working now.

## 2024-06-10 DIAGNOSIS — M25532 Pain in left wrist: Secondary | ICD-10-CM | POA: Diagnosis not present

## 2024-06-15 DIAGNOSIS — K080 Exfoliation of teeth due to systemic causes: Secondary | ICD-10-CM | POA: Diagnosis not present

## 2024-07-02 ENCOUNTER — Other Ambulatory Visit: Payer: Self-pay | Admitting: Primary Care

## 2024-07-26 ENCOUNTER — Other Ambulatory Visit: Payer: Self-pay | Admitting: Primary Care

## 2025-05-18 ENCOUNTER — Inpatient Hospital Stay: Attending: Hematology and Oncology | Admitting: Hematology and Oncology
